# Patient Record
Sex: Female | Born: 1985 | Race: White | Hispanic: No | Marital: Single | Smoking: Former smoker
Health system: Southern US, Community
[De-identification: ages and names within clinical notes are randomized; demographics above are authoritative.]

## PROBLEM LIST (undated history)

## (undated) DIAGNOSIS — F419 Anxiety disorder, unspecified: Secondary | ICD-10-CM

## (undated) DIAGNOSIS — R519 Headache, unspecified: Secondary | ICD-10-CM

## (undated) DIAGNOSIS — G40909 Epilepsy, unspecified, not intractable, without status epilepticus: Secondary | ICD-10-CM

## (undated) DIAGNOSIS — N809 Endometriosis, unspecified: Secondary | ICD-10-CM

## (undated) DIAGNOSIS — E669 Obesity, unspecified: Secondary | ICD-10-CM

## (undated) DIAGNOSIS — R569 Unspecified convulsions: Secondary | ICD-10-CM

## (undated) DIAGNOSIS — R51 Headache: Secondary | ICD-10-CM

## (undated) HISTORY — PX: COLON SURGERY: SHX602

## (undated) HISTORY — DX: Epilepsy, unspecified, not intractable, without status epilepticus: G40.909

## (undated) HISTORY — DX: Endometriosis, unspecified: N80.9

## (undated) HISTORY — DX: Obesity, unspecified: E66.9

## (undated) HISTORY — PX: TUBAL LIGATION: SHX77

---
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
## 2004-03-05 ENCOUNTER — Inpatient Hospital Stay (HOSPITAL_COMMUNITY): Admission: EM | Admit: 2004-03-05 | Discharge: 2004-03-06 | Payer: Self-pay | Admitting: Emergency Medicine

## 2004-03-16 ENCOUNTER — Ambulatory Visit (HOSPITAL_COMMUNITY): Admission: RE | Admit: 2004-03-16 | Discharge: 2004-03-16 | Payer: Self-pay | Admitting: *Deleted

## 2004-04-02 ENCOUNTER — Observation Stay (HOSPITAL_COMMUNITY): Admission: EM | Admit: 2004-04-02 | Discharge: 2004-04-03 | Payer: Self-pay

## 2004-07-21 ENCOUNTER — Ambulatory Visit (HOSPITAL_COMMUNITY): Admission: AD | Admit: 2004-07-21 | Discharge: 2004-07-21 | Payer: Self-pay | Admitting: *Deleted

## 2004-07-30 ENCOUNTER — Ambulatory Visit (HOSPITAL_COMMUNITY): Admission: RE | Admit: 2004-07-30 | Discharge: 2004-07-30 | Payer: Self-pay | Admitting: *Deleted

## 2004-08-11 ENCOUNTER — Inpatient Hospital Stay (HOSPITAL_COMMUNITY): Admission: RE | Admit: 2004-08-11 | Discharge: 2004-08-13 | Payer: Self-pay | Admitting: *Deleted

## 2005-12-29 ENCOUNTER — Emergency Department (HOSPITAL_COMMUNITY): Admission: EM | Admit: 2005-12-29 | Discharge: 2005-12-29 | Payer: Self-pay | Admitting: Emergency Medicine

## 2006-04-09 ENCOUNTER — Emergency Department (HOSPITAL_COMMUNITY): Admission: EM | Admit: 2006-04-09 | Discharge: 2006-04-09 | Payer: Self-pay | Admitting: Emergency Medicine

## 2007-02-14 ENCOUNTER — Emergency Department (HOSPITAL_COMMUNITY): Admission: EM | Admit: 2007-02-14 | Discharge: 2007-02-14 | Payer: Self-pay | Admitting: Emergency Medicine

## 2007-04-10 ENCOUNTER — Emergency Department (HOSPITAL_COMMUNITY): Admission: EM | Admit: 2007-04-10 | Discharge: 2007-04-10 | Payer: Self-pay | Admitting: Emergency Medicine

## 2008-03-09 ENCOUNTER — Emergency Department (HOSPITAL_COMMUNITY): Admission: EM | Admit: 2008-03-09 | Discharge: 2008-03-09 | Payer: Self-pay | Admitting: Emergency Medicine

## 2008-09-28 ENCOUNTER — Emergency Department (HOSPITAL_COMMUNITY): Admission: EM | Admit: 2008-09-28 | Discharge: 2008-09-28 | Payer: Self-pay | Admitting: Emergency Medicine

## 2008-12-10 ENCOUNTER — Emergency Department (HOSPITAL_COMMUNITY): Admission: EM | Admit: 2008-12-10 | Discharge: 2008-12-10 | Payer: Self-pay | Admitting: Emergency Medicine

## 2009-04-18 ENCOUNTER — Emergency Department (HOSPITAL_COMMUNITY): Admission: EM | Admit: 2009-04-18 | Discharge: 2009-04-18 | Payer: Self-pay | Admitting: Emergency Medicine

## 2009-06-14 ENCOUNTER — Emergency Department (HOSPITAL_COMMUNITY): Admission: EM | Admit: 2009-06-14 | Discharge: 2009-06-14 | Payer: Self-pay | Admitting: Emergency Medicine

## 2009-06-30 ENCOUNTER — Ambulatory Visit (HOSPITAL_COMMUNITY): Admission: RE | Admit: 2009-06-30 | Discharge: 2009-06-30 | Payer: Self-pay | Admitting: Obstetrics and Gynecology

## 2009-07-20 ENCOUNTER — Ambulatory Visit (HOSPITAL_COMMUNITY): Admission: RE | Admit: 2009-07-20 | Discharge: 2009-07-20 | Payer: Self-pay | Admitting: Obstetrics and Gynecology

## 2009-08-10 ENCOUNTER — Ambulatory Visit (HOSPITAL_COMMUNITY): Admission: RE | Admit: 2009-08-10 | Discharge: 2009-08-10 | Payer: Self-pay | Admitting: Obstetrics and Gynecology

## 2009-08-31 ENCOUNTER — Ambulatory Visit (HOSPITAL_COMMUNITY): Admission: RE | Admit: 2009-08-31 | Discharge: 2009-08-31 | Payer: Self-pay | Admitting: Obstetrics and Gynecology

## 2009-09-21 ENCOUNTER — Inpatient Hospital Stay (HOSPITAL_COMMUNITY): Admission: RE | Admit: 2009-09-21 | Discharge: 2009-09-24 | Payer: Self-pay | Admitting: Obstetrics and Gynecology

## 2009-09-21 ENCOUNTER — Encounter (INDEPENDENT_AMBULATORY_CARE_PROVIDER_SITE_OTHER): Payer: Self-pay | Admitting: Obstetrics and Gynecology

## 2009-11-02 ENCOUNTER — Emergency Department (HOSPITAL_COMMUNITY): Admission: EM | Admit: 2009-11-02 | Discharge: 2009-11-02 | Payer: Self-pay | Admitting: Emergency Medicine

## 2009-11-03 ENCOUNTER — Ambulatory Visit (HOSPITAL_COMMUNITY): Admission: RE | Admit: 2009-11-03 | Discharge: 2009-11-03 | Payer: Self-pay | Admitting: Emergency Medicine

## 2010-01-28 ENCOUNTER — Emergency Department (HOSPITAL_COMMUNITY): Admission: EM | Admit: 2010-01-28 | Discharge: 2010-01-28 | Payer: Self-pay | Admitting: Emergency Medicine

## 2010-04-28 IMAGING — CR DG ABDOMEN ACUTE W/ 1V CHEST
3 series · 3 of 3 positions shown · non-contrast
Comparison: None.

CLINICAL DATA: Abdominal pain.  6 weeks postpartum by cesarean
section.  Asthma.

ACUTE ABDOMEN SERIES (ABDOMEN 2 VIEW & CHEST 1 VIEW)

[view not recorded (1 of 3)]
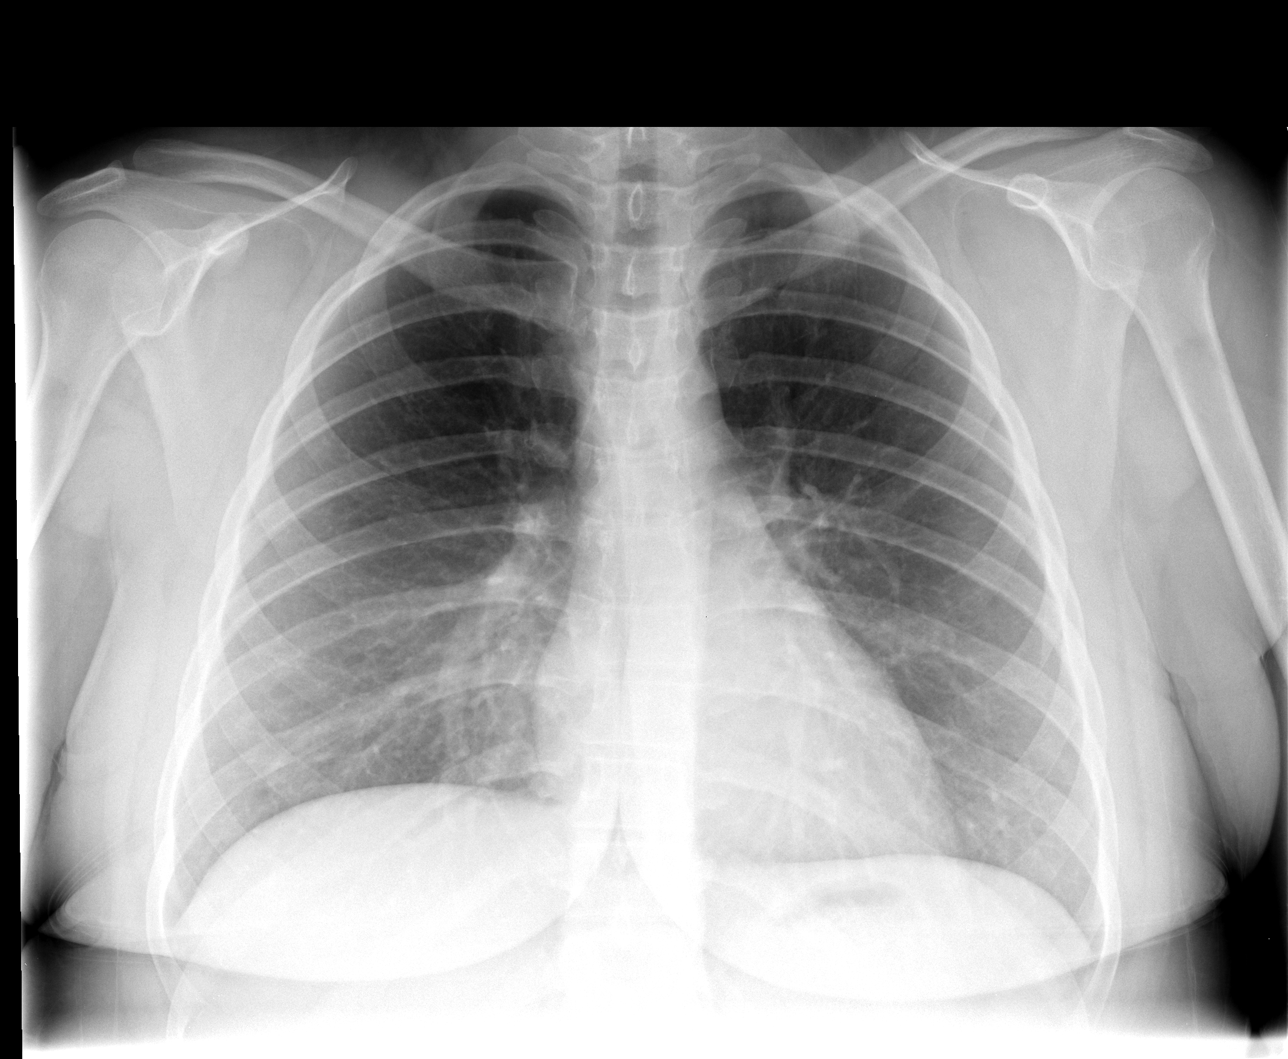

[view not recorded (2 of 3)]
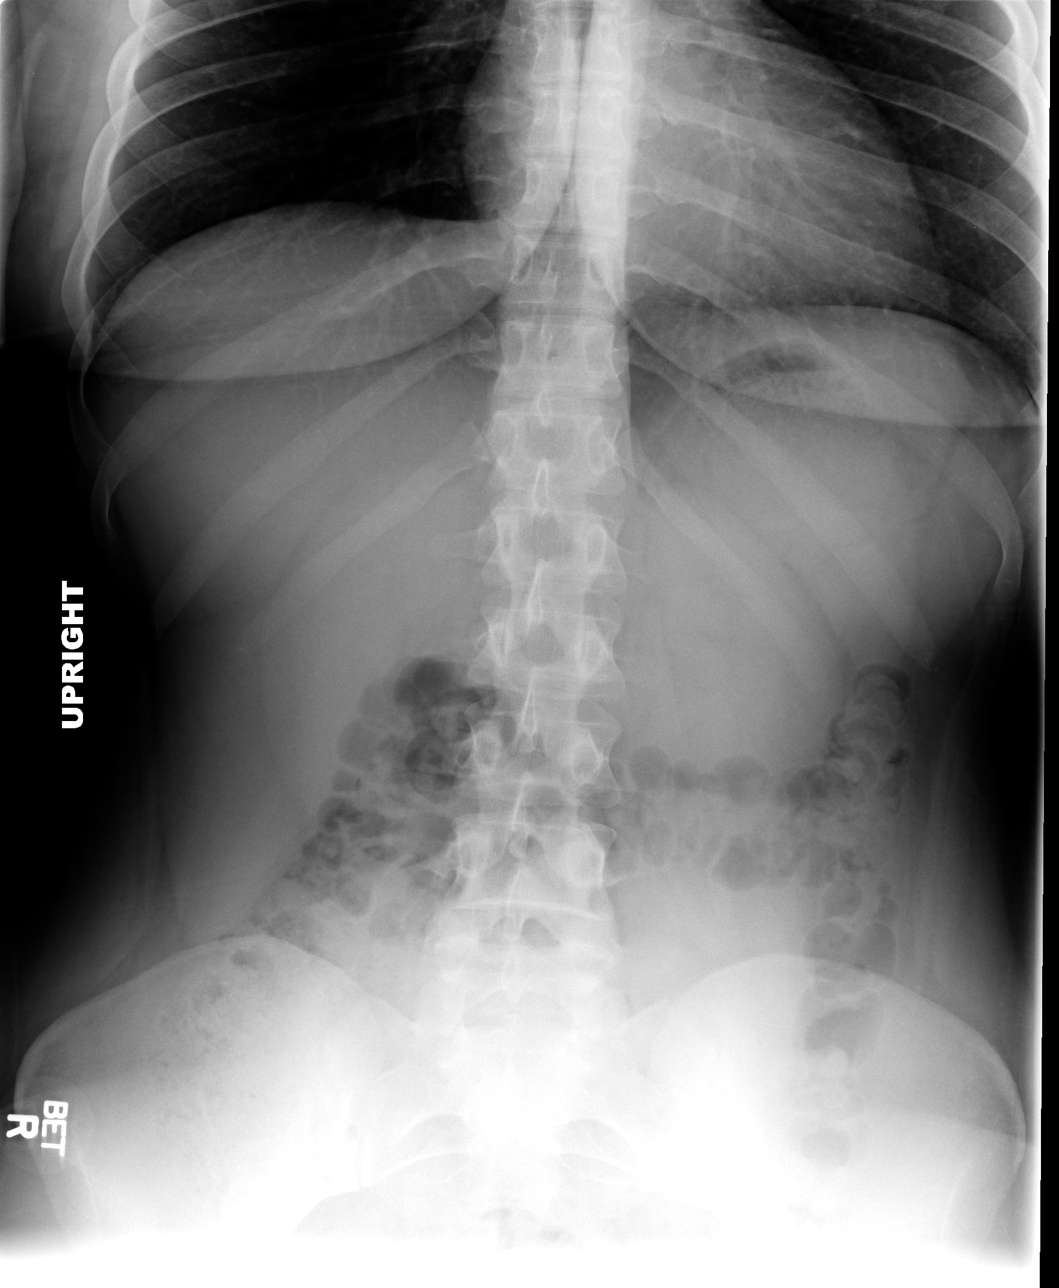

[view not recorded (3 of 3)]
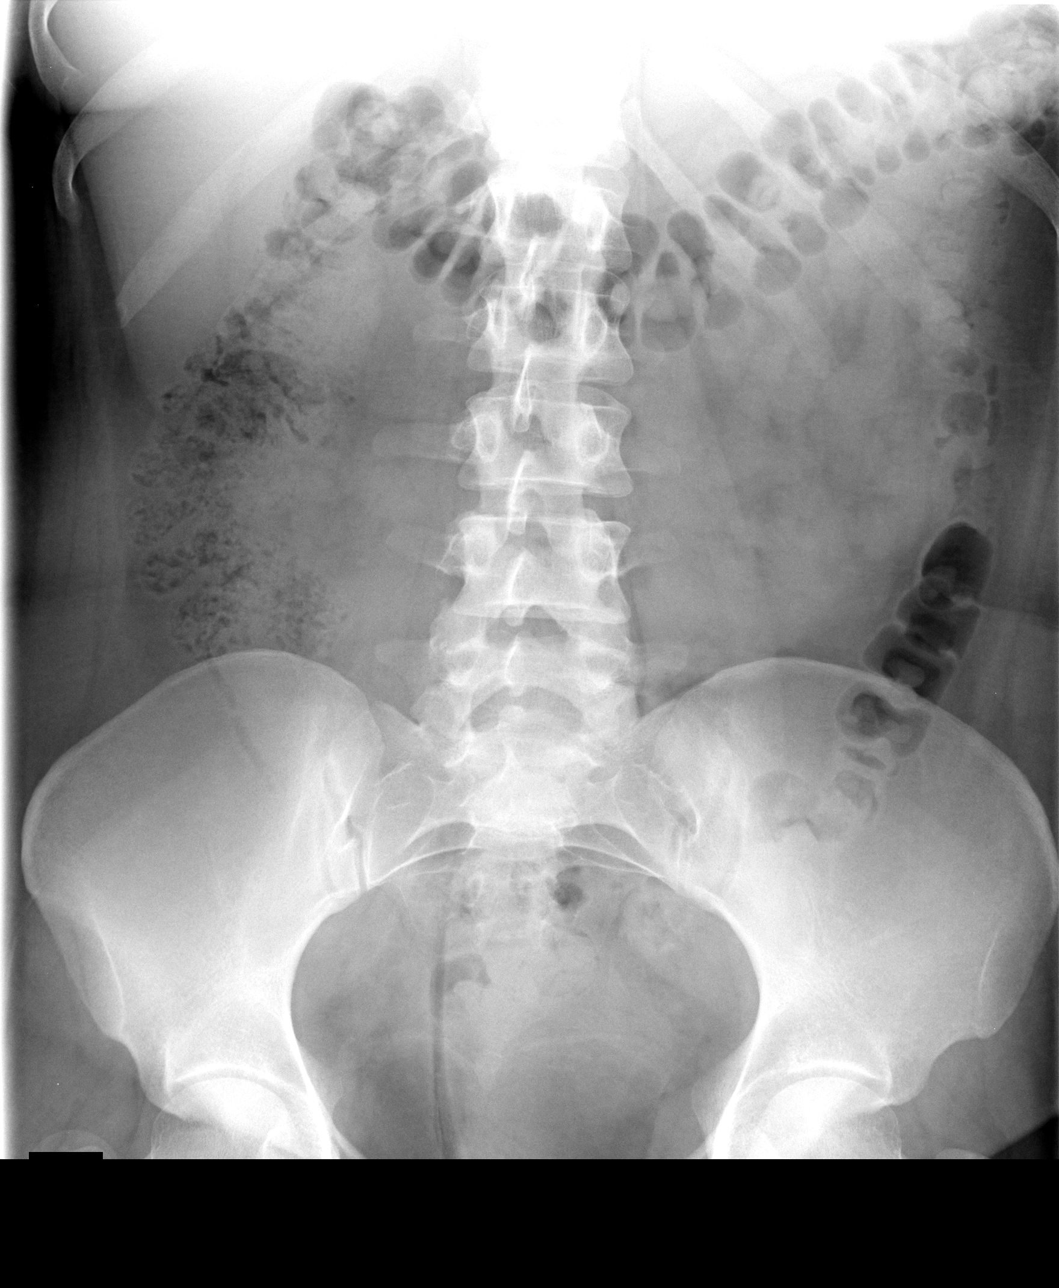

[3 of 3 positions shown; findings below may reference images not displayed]

FINDINGS: Cardiac and mediastinal contours appear normal.

The lungs appear clear.

No pleural effusion is identified.  No free intraperitoneal gas is
evident beneath the hemidiaphragms.  Gas and stool in the colon
appears normal.  No dilated bowel identified.  There appears to be
gas in the appendix, indicating a high likelihood of lack of
appendicitis.
IMPRESSION: 1.  Normal bowel gas pattern.  The lungs appear clear.

## 2010-05-07 ENCOUNTER — Emergency Department (HOSPITAL_COMMUNITY): Admission: EM | Admit: 2010-05-07 | Discharge: 2010-05-07 | Payer: Self-pay | Admitting: Emergency Medicine

## 2010-06-24 ENCOUNTER — Emergency Department (HOSPITAL_COMMUNITY)
Admission: EM | Admit: 2010-06-24 | Discharge: 2010-06-24 | Payer: Self-pay | Source: Home / Self Care | Admitting: Emergency Medicine

## 2010-09-26 LAB — CBC
HCT: 41.2 % (ref 36.0–46.0)
Hemoglobin: 14.6 g/dL (ref 12.0–15.0)
MCH: 30.7 pg (ref 26.0–34.0)
MCHC: 35.4 g/dL (ref 30.0–36.0)
MCV: 86.7 fL (ref 78.0–100.0)
RBC: 4.75 MIL/uL (ref 3.87–5.11)
RDW: 13 % (ref 11.5–15.5)
WBC: 7.3 10*3/uL (ref 4.0–10.5)

## 2010-09-26 LAB — BASIC METABOLIC PANEL
BUN: 8 mg/dL (ref 6–23)
Chloride: 108 mEq/L (ref 96–112)
Creatinine, Ser: 0.77 mg/dL (ref 0.4–1.2)

## 2010-09-26 LAB — DIFFERENTIAL
Eosinophils Absolute: 0.1 10*3/uL (ref 0.0–0.7)
Eosinophils Relative: 1 % (ref 0–5)
Lymphs Abs: 3.1 10*3/uL (ref 0.7–4.0)
Neutro Abs: 3.7 10*3/uL (ref 1.7–7.7)

## 2010-09-26 LAB — PHENYTOIN LEVEL, TOTAL: Phenytoin Lvl: 3.7 ug/mL — ABNORMAL LOW (ref 10.0–20.0)

## 2010-09-26 LAB — POCT CARDIAC MARKERS: Myoglobin, poc: 63.7 ng/mL (ref 12–200)

## 2010-09-28 LAB — CBC
HCT: 42.7 % (ref 36.0–46.0)
Hemoglobin: 14.7 g/dL (ref 12.0–15.0)
MCHC: 34.4 g/dL (ref 30.0–36.0)
Platelets: 352 10*3/uL (ref 150–400)
WBC: 7 10*3/uL (ref 4.0–10.5)

## 2010-09-28 LAB — DIFFERENTIAL
Basophils Absolute: 0 10*3/uL (ref 0.0–0.1)
Basophils Relative: 1 % (ref 0–1)
Eosinophils Absolute: 0.1 10*3/uL (ref 0.0–0.7)
Eosinophils Relative: 1 % (ref 0–5)
Lymphocytes Relative: 38 % (ref 12–46)
Lymphs Abs: 2.7 10*3/uL (ref 0.7–4.0)
Monocytes Absolute: 0.4 10*3/uL (ref 0.1–1.0)
Monocytes Relative: 6 % (ref 3–12)

## 2010-09-28 LAB — PREGNANCY, URINE: Preg Test, Ur: NEGATIVE

## 2010-09-28 LAB — COMPREHENSIVE METABOLIC PANEL
AST: 38 U/L — ABNORMAL HIGH (ref 0–37)
BUN: 6 mg/dL (ref 6–23)
Chloride: 107 mEq/L (ref 96–112)
Creatinine, Ser: 0.7 mg/dL (ref 0.4–1.2)
GFR calc Af Amer: >60 mL/min (ref >60)
GFR calc non Af Amer: >60 mL/min (ref >60)
Potassium: 4.3 mEq/L (ref 3.5–5.1)
Sodium: 137 mEq/L (ref 135–145)
Total Bilirubin: 0.2 mg/dL — ABNORMAL LOW (ref 0.3–1.2)
Total Protein: 7.2 g/dL (ref 6.0–8.3)

## 2010-09-28 LAB — URINALYSIS, ROUTINE W REFLEX MICROSCOPIC
Bilirubin Urine: NEGATIVE
Glucose, UA: NEGATIVE mg/dL
Ketones, ur: NEGATIVE mg/dL
Nitrite: NEGATIVE
pH: 7 (ref 5.0–8.0)

## 2010-09-28 LAB — POCT PREGNANCY, URINE: Preg Test, Ur: NEGATIVE

## 2010-10-01 LAB — DIFFERENTIAL
Eosinophils Absolute: 0 10*3/uL (ref 0.0–0.7)
Eosinophils Relative: 0 % (ref 0–5)
Lymphs Abs: 1.8 10*3/uL (ref 0.7–4.0)
Monocytes Relative: 5 % (ref 3–12)

## 2010-10-01 LAB — CBC
Hemoglobin: 12.8 g/dL (ref 12.0–15.0)
MCH: 31.1 pg (ref 26.0–34.0)
MCV: 89.5 fL (ref 78.0–100.0)
RBC: 4.1 MIL/uL (ref 3.87–5.11)
RDW: 12.5 % (ref 11.5–15.5)

## 2010-10-01 LAB — URINALYSIS, ROUTINE W REFLEX MICROSCOPIC
Bilirubin Urine: NEGATIVE
Hgb urine dipstick: NEGATIVE
Specific Gravity, Urine: >1.03 — ABNORMAL HIGH (ref 1.005–1.030)
pH: 5.5 (ref 5.0–8.0)

## 2010-10-01 LAB — BASIC METABOLIC PANEL
CO2: 22 mEq/L (ref 19–32)
Chloride: 110 mEq/L (ref 96–112)
GFR calc Af Amer: >60 mL/min (ref >60)
Sodium: 138 mEq/L (ref 135–145)

## 2010-10-04 LAB — COMPREHENSIVE METABOLIC PANEL
Albumin: 4.1 g/dL (ref 3.5–5.2)
BUN: 14 mg/dL (ref 6–23)
Chloride: 106 mEq/L (ref 96–112)
Creatinine, Ser: 1.07 mg/dL (ref 0.4–1.2)
GFR calc non Af Amer: >60 mL/min (ref >60)
Glucose, Bld: 84 mg/dL (ref 70–99)
Total Bilirubin: 0.3 mg/dL (ref 0.3–1.2)

## 2010-10-04 LAB — DIFFERENTIAL
Basophils Absolute: 0 10*3/uL (ref 0.0–0.1)
Lymphocytes Relative: 35 % (ref 12–46)
Monocytes Absolute: 0.6 10*3/uL (ref 0.1–1.0)
Neutro Abs: 5.1 10*3/uL (ref 1.7–7.7)
Neutrophils Relative %: 57 % (ref 43–77)

## 2010-10-04 LAB — CBC
HCT: 39.2 % (ref 36.0–46.0)
MCV: 88.6 fL (ref 78.0–100.0)
Platelets: 347 10*3/uL (ref 150–400)
RDW: 12.6 % (ref 11.5–15.5)
WBC: 9 10*3/uL (ref 4.0–10.5)

## 2010-10-04 LAB — URINALYSIS, ROUTINE W REFLEX MICROSCOPIC
Nitrite: NEGATIVE
Protein, ur: NEGATIVE mg/dL
Specific Gravity, Urine: >1.03 — ABNORMAL HIGH (ref 1.005–1.030)
Urobilinogen, UA: 0.2 mg/dL (ref 0.0–1.0)

## 2010-10-04 LAB — LIPASE, BLOOD: Lipase: 48 U/L (ref 11–59)

## 2010-10-04 LAB — PREGNANCY, URINE: Preg Test, Ur: NEGATIVE

## 2010-10-10 LAB — CBC
HCT: 33 % — ABNORMAL LOW (ref 36.0–46.0)
Hemoglobin: 11.4 g/dL — ABNORMAL LOW (ref 12.0–15.0)
Platelets: 192 10*3/uL (ref 150–400)
Platelets: 228 10*3/uL (ref 150–400)
RDW: 12.9 % (ref 11.5–15.5)
RDW: 13 % (ref 11.5–15.5)
WBC: 11.2 10*3/uL — ABNORMAL HIGH (ref 4.0–10.5)
WBC: 9.4 10*3/uL (ref 4.0–10.5)

## 2010-10-10 LAB — DIFFERENTIAL
Basophils Absolute: 0 10*3/uL (ref 0.0–0.1)
Lymphocytes Relative: 23 % (ref 12–46)
Lymphs Abs: 2.2 10*3/uL (ref 0.7–4.0)
Neutrophils Relative %: 71 % (ref 43–77)

## 2010-10-10 LAB — RPR: RPR Ser Ql: NONREACTIVE

## 2010-10-19 LAB — DIFFERENTIAL
Lymphocytes Relative: 15 % (ref 12–46)
Lymphs Abs: 2.3 10*3/uL (ref 0.7–4.0)
Monocytes Relative: 1 % — ABNORMAL LOW (ref 3–12)
Neutro Abs: 12.4 10*3/uL — ABNORMAL HIGH (ref 1.7–7.7)
Neutrophils Relative %: 83 % — ABNORMAL HIGH (ref 43–77)

## 2010-10-19 LAB — URINALYSIS, ROUTINE W REFLEX MICROSCOPIC
Bilirubin Urine: NEGATIVE
Ketones, ur: NEGATIVE mg/dL
Leukocytes, UA: NEGATIVE
Nitrite: NEGATIVE
Specific Gravity, Urine: 1.02 (ref 1.005–1.030)
Urobilinogen, UA: 0.2 mg/dL (ref 0.0–1.0)
pH: 6 (ref 5.0–8.0)

## 2010-10-19 LAB — PREGNANCY, URINE: Preg Test, Ur: POSITIVE

## 2010-10-19 LAB — CBC
HCT: 32.5 % — ABNORMAL LOW (ref 36.0–46.0)
Hemoglobin: 11.6 g/dL — ABNORMAL LOW (ref 12.0–15.0)
MCHC: 35.7 g/dL (ref 30.0–36.0)
MCV: 93.7 fL (ref 78.0–100.0)
Platelets: 283 10*3/uL (ref 150–400)
RDW: 13.2 % (ref 11.5–15.5)

## 2010-10-19 LAB — BASIC METABOLIC PANEL
BUN: 2 mg/dL — ABNORMAL LOW (ref 6–23)
CO2: 21 mEq/L (ref 19–32)
Chloride: 102 mEq/L (ref 96–112)
Glucose, Bld: 86 mg/dL (ref 70–99)
Potassium: 3.4 mEq/L — ABNORMAL LOW (ref 3.5–5.1)
Sodium: 131 mEq/L — ABNORMAL LOW (ref 135–145)

## 2010-10-19 LAB — GLUCOSE, CAPILLARY: Glucose-Capillary: 81 mg/dL (ref 70–99)

## 2010-10-19 LAB — URINE MICROSCOPIC-ADD ON

## 2010-10-19 LAB — HCG, QUANTITATIVE, PREGNANCY: hCG, Beta Chain, Quant, S: 3880 m[IU]/mL — ABNORMAL HIGH (ref <5)

## 2010-10-20 LAB — BASIC METABOLIC PANEL
Chloride: 104 mEq/L (ref 96–112)
GFR calc Af Amer: >60 mL/min (ref >60)
GFR calc non Af Amer: >60 mL/min (ref >60)
Potassium: 3.3 mEq/L — ABNORMAL LOW (ref 3.5–5.1)
Sodium: 132 mEq/L — ABNORMAL LOW (ref 135–145)

## 2010-10-20 LAB — URINE MICROSCOPIC-ADD ON

## 2010-10-20 LAB — URINALYSIS, ROUTINE W REFLEX MICROSCOPIC
Glucose, UA: 100 mg/dL — AB
Hgb urine dipstick: NEGATIVE
Ketones, ur: NEGATIVE mg/dL
pH: 6 (ref 5.0–8.0)

## 2010-10-20 LAB — PHENYTOIN LEVEL, TOTAL: Phenytoin Lvl: 3.4 ug/mL — ABNORMAL LOW (ref 10.0–20.0)

## 2010-10-20 LAB — RAPID URINE DRUG SCREEN, HOSP PERFORMED
Barbiturates: NOT DETECTED
Benzodiazepines: NOT DETECTED
Cocaine: NOT DETECTED

## 2010-10-27 LAB — COMPREHENSIVE METABOLIC PANEL
ALT: 20 U/L (ref 0–35)
AST: 22 U/L (ref 0–37)
Albumin: 3.6 g/dL (ref 3.5–5.2)
Alkaline Phosphatase: 78 U/L (ref 39–117)
BUN: 9 mg/dL (ref 6–23)
Chloride: 106 mEq/L (ref 96–112)
Potassium: 3.9 mEq/L (ref 3.5–5.1)
Sodium: 135 mEq/L (ref 135–145)
Total Bilirubin: 0.4 mg/dL (ref 0.3–1.2)
Total Protein: 5.9 g/dL — ABNORMAL LOW (ref 6.0–8.3)

## 2010-10-27 LAB — DIFFERENTIAL
Basophils Absolute: 0 10*3/uL (ref 0.0–0.1)
Basophils Relative: 1 % (ref 0–1)
Eosinophils Absolute: 0.3 10*3/uL (ref 0.0–0.7)
Eosinophils Relative: 3 % (ref 0–5)
Monocytes Absolute: 0.5 10*3/uL (ref 0.1–1.0)
Monocytes Relative: 6 % (ref 3–12)
Neutro Abs: 4.2 10*3/uL (ref 1.7–7.7)

## 2010-10-27 LAB — CBC
HCT: 40.2 % (ref 36.0–46.0)
Platelets: 344 10*3/uL (ref 150–400)
RDW: 12.9 % (ref 11.5–15.5)
WBC: 8.8 10*3/uL (ref 4.0–10.5)

## 2010-11-17 ENCOUNTER — Emergency Department (HOSPITAL_COMMUNITY): Payer: Medicaid Other

## 2010-11-17 ENCOUNTER — Emergency Department (HOSPITAL_COMMUNITY)
Admission: EM | Admit: 2010-11-17 | Discharge: 2010-11-17 | Disposition: A | Discharge status: Home / Self Care | Payer: Medicaid Other | Attending: Emergency Medicine | Admitting: Emergency Medicine

## 2010-11-17 DIAGNOSIS — H53149 Visual discomfort, unspecified: Secondary | ICD-10-CM | POA: Insufficient documentation

## 2010-11-17 DIAGNOSIS — L259 Unspecified contact dermatitis, unspecified cause: Secondary | ICD-10-CM | POA: Insufficient documentation

## 2010-11-17 DIAGNOSIS — G40802 Other epilepsy, not intractable, without status epilepticus: Secondary | ICD-10-CM | POA: Insufficient documentation

## 2010-11-17 DIAGNOSIS — R51 Headache: Secondary | ICD-10-CM | POA: Insufficient documentation

## 2010-11-17 DIAGNOSIS — R110 Nausea: Secondary | ICD-10-CM | POA: Insufficient documentation

## 2010-11-17 LAB — CBC
Hemoglobin: 14.1 g/dL (ref 12.0–15.0)
MCH: 31.2 pg (ref 26.0–34.0)
MCHC: 34.5 g/dL (ref 30.0–36.0)
RDW: 12.9 % (ref 11.5–15.5)

## 2010-11-17 LAB — DIFFERENTIAL
Basophils Absolute: 0 10*3/uL (ref 0.0–0.1)
Basophils Relative: 0 % (ref 0–1)
Eosinophils Absolute: 0.1 10*3/uL (ref 0.0–0.7)
Eosinophils Relative: 1 % (ref 0–5)
Monocytes Absolute: 0.5 10*3/uL (ref 0.1–1.0)
Monocytes Relative: 6 % (ref 3–12)
Neutro Abs: 4.3 10*3/uL (ref 1.7–7.7)

## 2010-11-17 LAB — COMPREHENSIVE METABOLIC PANEL
ALT: 19 U/L (ref 0–35)
AST: 17 U/L (ref 0–37)
CO2: 25 mEq/L (ref 19–32)
Calcium: 9 mg/dL (ref 8.4–10.5)
Creatinine, Ser: 0.84 mg/dL (ref 0.4–1.2)
GFR calc Af Amer: >60 mL/min (ref >60)
GFR calc non Af Amer: >60 mL/min (ref >60)
Sodium: 138 mEq/L (ref 135–145)
Total Protein: 6.7 g/dL (ref 6.0–8.3)

## 2010-11-17 LAB — PHENYTOIN LEVEL, TOTAL: Phenytoin Lvl: 2.7 ug/mL — ABNORMAL LOW (ref 10.0–20.0)

## 2010-12-01 ENCOUNTER — Emergency Department (HOSPITAL_COMMUNITY)
Admission: EM | Admit: 2010-12-01 | Discharge: 2010-12-01 | Disposition: A | Discharge status: Home / Self Care | Payer: Medicaid Other | Attending: Emergency Medicine | Admitting: Emergency Medicine

## 2010-12-01 ENCOUNTER — Emergency Department (HOSPITAL_COMMUNITY): Payer: Medicaid Other

## 2010-12-01 DIAGNOSIS — G40909 Epilepsy, unspecified, not intractable, without status epilepticus: Secondary | ICD-10-CM | POA: Insufficient documentation

## 2010-12-01 DIAGNOSIS — R51 Headache: Secondary | ICD-10-CM | POA: Insufficient documentation

## 2010-12-01 LAB — URINALYSIS, ROUTINE W REFLEX MICROSCOPIC
Glucose, UA: NEGATIVE mg/dL
Hgb urine dipstick: NEGATIVE
Specific Gravity, Urine: 1.025 (ref 1.005–1.030)
Urobilinogen, UA: 0.2 mg/dL (ref 0.0–1.0)
pH: 6 (ref 5.0–8.0)

## 2010-12-01 LAB — CBC
HCT: 41.8 % (ref 36.0–46.0)
Hemoglobin: 14.4 g/dL (ref 12.0–15.0)
MCV: 91.1 fL (ref 78.0–100.0)
WBC: 8.6 10*3/uL (ref 4.0–10.5)

## 2010-12-01 LAB — DIFFERENTIAL
Basophils Absolute: 0 10*3/uL (ref 0.0–0.1)
Lymphocytes Relative: 35 % (ref 12–46)
Lymphs Abs: 3 10*3/uL (ref 0.7–4.0)
Monocytes Absolute: 0.5 10*3/uL (ref 0.1–1.0)
Neutro Abs: 5 10*3/uL (ref 1.7–7.7)

## 2010-12-01 LAB — BASIC METABOLIC PANEL
BUN: 12 mg/dL (ref 6–23)
CO2: 23 mEq/L (ref 19–32)
Chloride: 106 mEq/L (ref 96–112)
Glucose, Bld: 90 mg/dL (ref 70–99)
Potassium: 3.8 mEq/L (ref 3.5–5.1)
Sodium: 138 mEq/L (ref 135–145)

## 2010-12-02 NOTE — H&P (Signed)
NAME:  Whitney Scott, Whitney Scott               ACCOUNT NO.:  [id number]   MEDICAL RECORD NO.:  [id number]          PATIENT TYPE:  AMB   LOCATION:  DAY                           FACILITY:  [facility]   PHYSICIAN:  Langley Gauss, MD     DATE OF BIRTH:  05-Sep-1985   DATE OF ADMISSION:  08/11/2004  DATE OF DISCHARGE:  LH                                HISTORY & PHYSICAL   REASON FOR ADMISSION:  The patient is admitted for primary low transverse  cesarean section.   HISTORY OF PRESENT ILLNESS:  The patient is a 25 year old gravida 1, para 0  at 38+ weeks' gestation who was noted to have findings of relative CPD on  clinical pelvimetry; in addition, despite increased frequency of uterine  contractions, the patient has failed to have engagement of the fetal vertex.  She is noted to have a narrow mid-pelvis as well as very narrow pubic rami.  Pertinently, the patient and her mother state that they are built alike.  The patient's mother states that she herself experienced a very long labor.  Preparations were actually made to proceed with C-section, at which time  forceps and vacuum-assisted delivery were performed.  The patient's mother  states that the delivery itself was very difficult and she took an extended  period of time for recovery.  The patient herself is now very apprehensive  regarding a similar scenario during the course of labor, thus the patient  elects to proceed with primary low transverse cesarean section.  The patient  understands that there are increased risks associated with C-section  including increased blood loss, increased risk of infection, necessity of  proceeding with repeat C-section with subsequent pregnancies and possible  future problems with implantation of the placenta at the uterine incision  site.  The patient is also agreeable to proceed with spinal analgesic.  The  patient's prenatal course was complicated by new diagnosis of seizure  disorder; she was noted to have a  generalized seizure which was witnessed on  labor and delivery.  She was seen and evaluated by Dr. Darleen Crocker A. [place].  EEG was normal.  The patient subsequently also had sleep studies done which  were normal.  The patient is currently taking Keppra 500 mg p.o. q.a.m. and  500 mg p.o. q.p.m.  She has had no additional seizure activity now times  several months' duration, especially after achieving therapeutic dosage  range.  Pertinently, there is no other family history of seizure disorder,  nor does the patient experience or describe anything which would be  considered to be seizure activity prior to the pregnancy.   PAST MEDICAL HISTORY:  She had some type of bowel surgery as a child.   ALLERGIES:  She does describe multiple allergies including PENICILLIN,  BACTRIM, KEFLEX and also hallucinates by taking P.O. AMBIEN.   SOCIAL HISTORY:  The patient is a nonsmoker.  Father of the baby is named  Joseph Art, Montez Hageman.  The patient herself is not employed.   LABORATORY STUDIES OF PREGNANCY:  GBS carrier status is noted to be  negative.   PHYSICAL  EXAMINATION:  GENERAL:  Adolescent white female, 5 foot 3 inches,  prepregnancy 160s, most recent weight 192.  VITAL SIGNS:  Blood pressure is 111/73, pulse of 80, respiratory rate is 20.  HEENT:  Negative.  NECK:  No adenopathy.  Neck is supple.  Thyroid is nonpalpable.  LUNGS:  Lungs are clear.  CARDIOVASCULAR:  Regular rate and rhythm.  ABDOMEN:  Abdomen is soft and nontender.  No surgical scars are identified.  Fundal height is 38 cm.  She is vertex presentation by [employer].  Fetal heart tones auscultated in the 150s.  Good fetal movement is  described.   ACCESSORY CLINICAL DATA:  During most recent office visit dated August 03, 2004, the patient had complained of occasional episodes of trickling of  fluid.  At that time, ultrasonographic parameters revealed adequate fetal  growth and normal amniotic fluid volume; cervix, however,  remained closed  with the vertex non-engaged.   ASSESSMENT AND PLAN:  Following the findings on physical examination, the  patient is very adamant regarding her desire to proceed with primary low  transverse cesarean section; her mother likewise is in agreement with this  to avoid a difficult delivery such as she herself experienced.  The  patient's mother states that in retrospect, she feels she would have been  far better off had she delivered by cesarean section rather than a difficult  vacuum and forceps delivery.  Risks and benefits of the procedure have been  discussed with the patient, consents are signed and we will be proceeding  a.m. of August 11, 2004.  Dr. Francoise Schaumann Halm's office has previously been  notified.      DC/MEDQ  D:  08/11/2004  T:  08/11/2004  Job:  [id number]

## 2010-12-02 NOTE — Group Therapy Note (Signed)
NAME:  Whitney Scott, KNISLEY                         ACCOUNT NO.:  [id number]   MEDICAL RECORD NO.:  [id number]                   PATIENT TYPE:  INP   LOCATION:  A319                                 FACILITY:  [facility]   PHYSICIAN:  Scott A. Gerda Diss, M.D.               DATE OF BIRTH:  November 10, 1985   DATE OF PROCEDURE:  07/13/2003  DATE OF DISCHARGE:                                   PROGRESS NOTE   SUMMARY:  The patient overall is feeling slightly improved this morning.  No  vomiting.  Tolerating liquids.  Sodium has come up, white count looks good.  Still has chest wall tenderness.  Will add Solu-Medrol to help with the  costochondritis and switch from Cipro to Levaquin.  Probably in the hospital  for 48 hours.  Young patient also under a significant amount of  psychological stressors with her dad having terminal cancer.  This may be  playing some role into the severity of her symptoms but I truly feel that  she needs to be admitted in and treated because of failed outpatient  management, multiple ER visits and office visits for similar problem, the  hyponatremia, the severe costochondritis and the inability to keep any  medications down.      ___________________________________________                                            [name]. Gerda Diss, M.D.   SAL/MEDQ  D:  07/13/2003  T:  07/13/2003  Job:  [id number]
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
## 2010-12-02 NOTE — H&P (Signed)
NAME:  Whitney Scott, Whitney Scott                         ACCOUNT NO.:  [id number]   MEDICAL RECORD NO.:  [id number]                   PATIENT TYPE:  INP   LOCATION:  A319                                 FACILITY:  [facility]   PHYSICIAN:  Scott A. Gerda Diss, M.D.               DATE OF BIRTH:  08-Sep-1985   DATE OF ADMISSION:  07/12/2003  DATE OF DISCHARGE:                                HISTORY & PHYSICAL   CHIEF COMPLAINT:  Vomiting, chest pain.   HISTORY OF PRESENT ILLNESS:  An 25 year old white female who three weeks ago  had pneumonia and went to [facility].  Had an x-ray done  and was told she had pneumonia and placed on antibiotics.  Had a lot of  coughing, congestion.  Coughing and congestion continued and had left chest  wall pain, discomfort.  Proceeded on to have increased pain and discomfort  in the chest wall every time she took a deep breath or coughed, more in the  anterior chest.  From this, she was seen by Dr. Nobie Putnam, had another x-ray  done and was told she had walking pneumonia and placed on antibiotics and  then she followed up at [facility] approximately a week ago  and then was seen in our office by Baruch Merl, FMP, diagnosed with  costochondritis.  It was felt that infection had gone.  She was placed on  Phenergan, Vicodin and an anti-inflammatory, probably Prednisone.  She  continued to have chest wall pain, was unable to keep any of the medicine  down and threw up multiple times over the weekend.  Came to the emergency  department because of this.   PAST MEDICAL HISTORY:  Normal past medical history.   SOCIAL HISTORY:  Lives with parents.  Under significant distress because her  dad has terminal colon cancer.   FAMILY HISTORY:  Noncontributory.   REVIEW OF SYSTEMS:  Negative for headaches, abdominal pain, diarrhea,  dysuria, urinary frequency.   PHYSICAL EXAMINATION:  HEENT:  Benign.  NECK:  Supple.  CHEST:  Clear to auscultation,  coarse cough is noted.  Chest wall extremely  tender, especially in the anterior aspect.  HEART:  Regular.  ABDOMEN:  Soft.  EXTREMITIES:  No edema.   LABORATORY DATA:  CBC:  11.1 white count with no specific shift.  Sodium  127, BUN 9, creatinine 0.6, specific gravity greater than 1.030.  Ketones  are negative.  Blood is moderate.  Has a lot of epithelial cells along with  WBC's and bacteria.   ASSESSMENT/PLAN:  1. Recent walking pneumonia-still coughing, so we will go ahead and cover     for atypicals, place on Levaquin.  2. Costochondritis-I feel this is the patient's main problem, but she was     unable to tolerate any of the oral medicines, therefore, treat with IV     Solu-Medrol for a couple of days and then  hopefully, be able to set forth     a     oral regimen that we will work for.  3. Probable urinary tract infection-Levaquin should cover this.  4. Hyponatremia-IV normal saline should help with this as well.     ___________________________________________                                         [name]. Gerda Diss, M.D.   SAL/MEDQ  D:  07/13/2003  T:  07/13/2003  Job:  [id number]

## 2010-12-02 NOTE — Op Note (Signed)
[name], Scott               ACCOUNT NO.:  [id number]   MEDICAL RECORD NO.:  [id number]          PATIENT TYPE:  INP   LOCATION:  A410                          FACILITY:  [facility]   PHYSICIAN:  Langley Gauss, MD     DATE OF BIRTH:  July 22, 1985   DATE OF PROCEDURE:  08/11/2004  DATE OF DISCHARGE:                                 OPERATIVE REPORT   PREOPERATIVE DIAGNOSES:  1.  A 38-1/2-week intrauterine pregnancy.  2.  New onset seizure disorder, diagnosed during this pregnancy.  3.  Relative cephalopelvic disproportion with borderline pelvis.  4.  Patient desired to proceed with primary cesarean section.   POSTOPERATIVE DIAGNOSES:   OPERATION PERFORMED:  Primary low transverse cesarean section.   SURGEON:  Langley Gauss, MD   ESTIMATED BLOOD LOSS:  800 mL.   ANALGESIA:  Spinal.   COMPLICATIONS:  None.   SPECIMENS:  Arterial cord gas and cord blood were obtained from the  umbilical cord.  The placenta was examined and noted to be apparently intact  with a three-vessel umbilical cord.  The placenta was sent to the laboratory  due to maternal history of seizure disorder.   PEDIATRICIAN:  __________   DRAINS:  Jackson-Pratt drain placed in the subcutaneous space. A Foley  catheter was sterilely draining clear yellow urine.   DESCRIPTION OF PROCEDURE:  The patient was taken to the operating room.  Vital signs are stable.  Fetal heart tone was obtained.  In a seated  position, spinal analgesic was administered without complications.  The  patient was placed on the operating room table with a slight left lateral  tilt, prepped and draped in the usual sterile manner after assurance of  adequate surgical analgesia.  Pfannenstiel incision was utilized and  dissected down to the fascial plane utilizing the knife cauterizing bleeders  along the way.  The fascia was then incised in a transverse curvilinear  manner while sharply dissecting off the underlying rectus muscle.  This  was  done atraumatically.  The fascial edges were grasped using straight Kocher  clamps, the fascia was dissected off the underlying rectus muscle in the  midline, both superiorly and inferiorly utilizing the Mayo scissors to  improve the operative space.  The rectus muscles then bluntly separated.  The peritoneal cavity was atraumatically bluntly entered at the superior-  most portion of the incision.  The peritoneal incision extended superiorly  and inferiorly.  Inferiorly we directly visualized the bladder to avoid its  accidental injury.  Bladder blade was then placed.  The lower uterine  segment was identified.  A bladder flap was then created from the  vesicouterine fold in the avascular plane.  A knife was used to score a low  transverse incision.  The lower uterine segment was noted to be markedly  thin.  Intact amniotic sac was encountered.  My index finger was used to  bluntly extend the low transverse uterine incision.  Allis clamp was used to  artificially rupture membranes with findings of clear amniotic fluid.  The  right hand then reached into the uterine cavity.  The  head of the infant was  then flexed and elevated to the level of the uterine incision.  The  disposable Silastic suction connected to wall suction was then applied to  the infant's vertex.  Gentle traction combined with fundal pressure results  in very easy atraumatic delivery of the infant's vertex.  Mouth and nares  were bulb suctioned, no clear amniotic fluid.  The remainder of the infant  likewise delivered atraumatically.  Umbilical cord was milked toward the  infant.  The cord was doubly clamped and cut.  Mouth was again bulb  suctioned of clear amniotic fluid.  __________  resulted in separation which  upon examination appears to be intact placenta and associated 3-vessel  umbilical cord.  The uterus was exteriorized.  Intrauterine exploration  revealed no retained placenta fragments.  Uterine incision  was not extended.  The uterine incision was closed in two layers with 0 chromic in a running  locked fashion, second layer being an imbricating layer.  This resulted in  excellent hemostasis.  Cul-de-sac was irrigated free of all clots.  Tubes  and ovaries noted to be normal in appearance.  Several __________  cysts  were identified in the left adnexal region.  The uterus was returned to the  pelvic cavity.  Sponge, needle and instrument counts were correct times two  at this point.  Peritoneal edges were grasped using Kelly clamps.  The  peritoneum and overlying rectus muscle were closed with a continuous running  0 Vicryl suture.  The fascia was closed with a continuous running #1 PDS  suture.  Subcutaneous fat was examined for bleeders.  Bleeders were  cauterized.  Jackson-Pratt drain was placed in the subcu space through a  separate exit wound through the left apex of the incision.  This was sutured  in place.  Three horizontal mattress sutures of #1 PDS suture were placed as  retention type sutures.  The skin was then completely closed utilizing skin  staples.  Blood loss 30 mL.  0.5% bupivacaine injected along the entirety of  the incision to facilitate postoperative analgesia.  The patient was then  transferred to the recovery room in stable condition.  She continues to  drain clear yellow urine.  Operative findings discussed with the patient's  waiting family.  The patient does plan on bottle feeding.      DC/MEDQ  D:  08/11/2004  T:  08/11/2004  Job:  [id number]

## 2010-12-02 NOTE — Discharge Summary (Signed)
[name], Whitney Scott               ACCOUNT NO.:  [id number]   MEDICAL RECORD NO.:  [id number]          PATIENT TYPE:  INP   LOCATION:  A428                          FACILITY:  [facility]   PHYSICIAN:  Langley Gauss, MD     DATE OF BIRTH:  05/16/1986   DATE OF ADMISSION:  08/11/2004  DATE OF DISCHARGE:  01/28/2006LH                                 DISCHARGE SUMMARY   DIAGNOSES:  1.  A 38-plus-week intrauterine pregnancy.  2.  New-onset seizure disorder during this pregnancy.  3.  Relative cephalopelvic disproportion.   PROCEDURE PERFORMED:  Primary low transverse cesarean section with delivery  of viable, vigorous female infant.   DISPOSITION:  The patient is to follow up in the office in 4 days' time for  staple removal from Pfannenstiel incision.  JP drain is removed at the time  of discharge.   DISCHARGE MEDICATIONS:  1.  Tylox number 30.  2.  Phenergan number 30.  3.  In addition, the patient is to continue with the Keppra 1000 mg p.o.      b.i.d.   She is advised to follow up with Dr. Gerilyn Pilgrim for continued management.  She  was given copies of standardized discharge instructions.  Infant  circumcision is performed at the time of discharge, and the family is  prepared to meet their financial obligations.   CURRENT LABORATORY STUDIES:  Hemoglobin 12.8, hematocrit 36.2 with a white  count of 11.2.  Postoperative day number one 10.6/29.8 with a white count of  10.1.  GBS status is negative.   HOSPITAL COURSE:  Admitted August 11, 2004.  Primary low transverse  cesarean section was performed without complications.  She __________ spinal  analgesic.  Postoperatively  the patient did very well.  She had excellent  urine output and was able to ambulate and void on postoperative day number  one after removal of the Foley catheter.  She did well with the transition  to p.o. Tylox.  She had normal resumption of bowel function and  some minimal nausea which responded well to p.o.  Phenergan on the day of  discharge.  On postoperative day number two, the patient is now fully  ambulatory.  The vital signs have remained stable.  She is afebrile.  She  has been seizure free.  Thus, she was discharged to home on August 13, 2004.      DC/MEDQ  D:  08/13/2004  T:  08/13/2004  Job:  [id number]   cc:   Kofi A. Gerilyn Pilgrim, M.D.  [address], [place]  [place]  [place] [address]  Fax: [phone]

## 2010-12-02 NOTE — Discharge Summary (Signed)
NAME:  Whitney Scott, Whitney Scott                         ACCOUNT NO.:  [id number]   MEDICAL RECORD NO.:  [id number]                   PATIENT TYPE:  INP   LOCATION:  A416                                 FACILITY:   PHYSICIAN:  Langley Gauss, M.D.                DATE OF BIRTH:  January 09, 1986   DATE OF ADMISSION:  03/05/2004  DATE OF DISCHARGE:  03/07/2004                                 DISCHARGE SUMMARY   Admitted on March 05, 2004.  Hospital care subsequently provided by Dr.  Duane Lope, 03/06/2004, with discharge performed by Dr. Duane Lope in my  absence on March 07, 2004.   DISCHARGE DIAGNOSES:  1.  A 15-week intrauterine pregnancy.  2.  Seizure disorder.   CONSULTATIONS:  The patient was seen on March 05, 2004, by Dr. Darleen Crocker A.  [place], who recommended testing which was subsequently performed and also  made recommendations for medical therapy.   PERTINENT LABORATORY STUDIES:  MRI of the brain without contrast was  performed as requested which revealed no acute infarct, no evidence of  hypertensive encephalopathy.   EKG was performed which was noted to be within normal limits.   Urine drug screen was completely negative.  Hemoglobin 11.9, hematocrit  32.5, white count 11.5.  Electrolytes within normal limits with the  exception of mild hypokalemia at 3.3.  CK requested by Dr. Darleen Crocker A. [place]  is normal at 76.  Rubella immune.  Urinalysis completely unremarkable.  A  positive blood type.  Negative antibody screen.  RPR is nonreactive.   DISCHARGE MEDICATIONS:  The patient is to continue with prenatal vitamins.   The patient is started on Keppra category C drug.  She will be taking a dose  which seems to be 1 gram q.h.s. 500 mg q.a.m.   HOSPITAL COURSE:  Examination per Dr. Gerilyn Pilgrim revealed no neurological  changes.   I initially saw and I admitted the patient on March 05, 2004, with new  onset of seizure disorder.  The patient previously had what was felt to be  febrile  seizures as a child only.  She was admitted on March 05, 2004 for  continued evaluation.  The patient did well throughout the day.  She  tolerated a regular general diet.  However, there was a history that this  seizure had been witnessed by the husband, and thus was well documented  generalized seizure activity.  In the p.m. on March 05, 2004, in my absence  after I had signed out the patient to Dr. Lazaro Arms, the patient was  noted to have a grand mal seizure while on labor and delivery which was of  short duration, and no patient injury resulted.  Shortly thereafter, the  patient was seen by Dr. Gerilyn Pilgrim who made the therapeutic recommendations.   The patient was started on the p.o. Keppra.  Additional testing was all  essentially negative.  The patient did well  during the remainder of the  hospitalization with no recurrent seizure activity noted.  Thus, with the  symptom-free interval and no additional seizure activity, the patient was  prepared for discharged on March 07, 2004.  She was discharged in my  absence by Dr. Lazaro Arms.   FOLLOWUP:  She was given instructions to follow up in the office in one  weeks's time, or with additional seizure activity.  The patient has also  been given instructions to follow up by Dr. Gerilyn Pilgrim in the next one to two  weeks time.     ___________________________________________                                         Langley Gauss, M.D.   DC/MEDQ  D:  03/21/2004  T:  03/21/2004  Job:  [id number]

## 2010-12-02 NOTE — Consult Note (Signed)
NAME:  Whitney Scott, Whitney Scott                         ACCOUNT NO.:  [id number]   MEDICAL RECORD NO.:  [id number]                   PATIENT TYPE:  OBV   LOCATION:  A416                                 FACILITY:  [facility]   PHYSICIAN:  Kofi A. Gerilyn Pilgrim, M.D.              DATE OF BIRTH:  1985-10-15   DATE OF CONSULTATION:  DATE OF DISCHARGE:                                   CONSULTATION   CONSULTING PHYSICIAN:  Kofi A. Gerilyn Pilgrim, M.D.   REASON FOR CONSULTATION:  Seizures.   IMPRESSION:  Recurrent, unprovoked, generalized, tonic/clonic seizures.  No  clear risk factors are identified.   RECOMMENDATIONS:  1. Given the recurrent seizure activity that is unprovoked, I believe we     should start the patient on antiepileptic medication.  I had a lengthy     discussion with the patient's mother, discussing the pros and cons.  I     believe in general starting the seizure medication outweighs the     detrimental risk that are associated with seizures to both the fetus and     the mother.  Lamictal is probably the safest drug but this medication     requires a very slow titration over several weeks and months.  I would     recommend Keppra which is a class C.  Other medications such as Depakote,     Dilantin, phenobarbital, etcetera are class D medications.  We will start     her on a higher dose than usual.  We have given her a gram p.o. and then     have her take 500 mg in the morning.  2. I am going to suggest that a brain MRI be done ASAP.  3. At some point in time, an EEG should also be done when available.   HISTORY:  This is a young female who is [redacted] weeks pregnant with really no  risk factors for seizures.  She apparently had what appeared to be a  generalized tonic/clonic seizure as witnessed by her boyfriend this morning.  She seemed to have stiffening, flexion of the right upper extremity,  followed by jerking of all the other extremities.  It lasted for about a  minute.  She did have oral  trauma with blood coming from her mouth.  Afterwards, she was postictal additionally.  No urinary incontinence was  reported however.  The patient was on the floor, and just before I examined  her she apparently had another event that was witnessed by her mother.  Again, she had flexion type stiffening of the right upper extremity and  jerking of the other extremities.  No oral trauma was reported during this  event.  She did roll her eyes back and had some frothing.  The event was  also witnessed by one of the nursing staff.  The event lasted for about a  minute or so with some postictal lethargy  and confusion.  She apparently was  started on a magnesium bolus and has had some nausea afterwards.  This  patient does complain of some headaches associated with this seizure and  also the initial seizure this morning.  There is no family history of  seizures.  The patient apparently was born at term and was not premature.  No apparent complications.  There is no history of developmental delay.  No  history of head trauma, encephalitis, meningitis, head injuries, or stroke.  There is no family history of seizures.   PAST MEDICAL HISTORY:  __________  otherwise unremarkable.   FAMILY HISTORY:  Negative.   REVIEW OF SYSTEMS:  As in the history of present illness.   PHYSICAL EXAMINATION:  GENERAL:  Shows a mildly overweight lady in bed.  She  is in some distress from recent seizures and recurrent emesis.  VITAL SIGNS:  The latest vital signs:  Temperature 98, blood pressure 87/30,  pulse 70, respirations 18.  HEENT:  Atraumatic, normocephalic.  NECK:  Supple.  Also facial, head, and neck regions do not reveal any  bruits.  LUNGS:  Clear to auscultation bilaterally.  CARDIOVASCULAR:  Revealed normal S1 S2.  EXTREMITIES:  Show no edema.  SKIN:  Somewhat pale-looking.  NEUROLOGIC:  She is somewhat confused and she is postictal, but she is  responsive.  Cranial nerve evaluation shows that  pupils are 4-mm and  reactive.  Visual fields are intact.  Extraocular movements are full.  Face  and muscle strength is normal.  Tongue is midline.  Motor:  Shows she moves  all four extremities normally, symmetrically, and equally.  The exact  strength could not be tested.  No pronator drift is noted.  Coordination is  grossly unremarkable.  The patient has an upgoing toe on the right and down  on the left.  She responds to sensation to painful sensation bilaterally and  equally.   IMPORTANT LABORATORY EVALUATION:  Sodium 134.  The CBC is unremarkable.  Other chemistries are essentially unremarkable.  Urinalysis is negative, no  protein noted.   Thanks for this consultation.      ___________________________________________                                            Perlie Gold Gerilyn Pilgrim, M.D.   KAD/MEDQ  D:  03/04/2004  T:  03/04/2004  Job:  [id number]

## 2010-12-02 NOTE — Discharge Summary (Signed)
NAME:  Whitney Scott, Whitney Scott               ACCOUNT NO.:  [id number]   MEDICAL RECORD NO.:  [id number]           PATIENT TYPE:  OIB   LOCATION:                                FACILITY:  [facility]   PHYSICIAN:  Langley Gauss, MD     DATE OF BIRTH:  04-19-1986   DATE OF ADMISSION:  DATE OF DISCHARGE:  01/05/2006LH                                 DISCHARGE SUMMARY   A 25 year old gravida 1, para 0 at [redacted] weeks gestation who presents with the  chief complaint of one week ago and again this p.m. a trickle of clear fluid  with no odor but with the consistency of urine which ran down her leg. She  did not notify anyone regarding this occurrence one week previously.  She  also complains of some bloody show with wiping only and some pelvic pressure  and pelvic pain.  The patient's prenatal course has been complicated by  diagnosis and new finding of generalized seizure disorder. She had been  hospitalized during the early second trimester with a questionable history  of a seizure occurring at home; however, when she was observed during  hospitalization to have evidence of a grand mal seizure occurring here.  Dr.  Gerilyn Pilgrim evaluated the patient, agreed to the diagnosis of new onset seizure  disorder, and the patient was started on Keppra which she continues to take  throughout the pregnancy at 500 q.a.m. and 500 q.h.s.  She has been seizure  free since that time.   The patient is compliant with medications.  Her mother stayed with her most  of her pregnancy, and her mother is very certain that the seizures occur  only when she is under times of great stress due to presence of her  boyfriend.  She otherwise has had good fetal growth and no side effects from  the Keppra.  No history of head trauma and, as stated previously, no prior  history of any type of seizure disorder.   PAST MEDICAL HISTORY:  Otherwise negative.   PAST SURGICAL HISTORY:  Otherwise negative.   ALLERGIES:  No known drug allergies.   PHYSICAL EXAMINATION:  GENERAL: Very immature white female.  Boyfriend  present.  VITAL SIGNS: Temperature 97.8, pulse 95, respiratory rate 16, blood pressure  112/68.  HEENT:  Negative.  NECK:  No adenopathy.  Neck is supple.  Thyroid is not palpable.  LUNGS:  Clear.  CARDIOVASCULAR:  Regular rate and rhythm.  ABDOMEN:  Soft and nontender.  No surgical scars are identified. She is  vertex presentation by Leopold's maneuvers with a fundal height of 36 cm.  EXTREMITIES:  Normal.  PELVIC:  Normal external genitalia.  No lesions or ulcerations identified.  Sterile speculum examination is performed.  There is no pooling of any  amniotic fluid noted, rather normal, mucoid-appearing discharge.  Wet prep  is performed. Nitrazine test is negative.  Cervix appears closed.  Digital  examination reveals cervix to be closed.  External fetal monitor reveals  reassuring fetal heart rate with normal long-term variability.  No  significant uterine activity is identified.  IMPRESSION AND PLAN:  A 35-week intrauterine pregnancy with vague abdominal  pain but no evidence of any single event uterine activity.  The vaginal  discharge is most consistent with normal-appearing leukorrhea and is not  rupture of membranes.  Signs and symptoms of labor as well as spontaneous  rupture of membranes reviewed with the patient who is discharged to home on  same date of service to continue with Keppra and to continue with prenatal  visits according to previously scheduled appointments.     Vira Blanco   DC/MEDQ  D:  07/24/2004  T:  07/24/2004  Job:  [id number]

## 2010-12-02 NOTE — Op Note (Signed)
NAME:  Whitney Scott, Whitney Scott                         ACCOUNT NO.:  [id number]   MEDICAL RECORD NO.:  [id number]                   PATIENT TYPE:  OBV   LOCATION:  A412                                 FACILITY:  [facility]   PHYSICIAN:  Langley Gauss, M.D.                DATE OF BIRTH:  08-29-1985   DATE OF PROCEDURE:  04/03/2004  DATE OF DISCHARGE:  04/03/2004                                 OPERATIVE REPORT   PROCEDURE:  Pammy Vesey is currently hospitalized for renewed seizure  activity.  On the day previously, fetal heart tones had been obtained with  some difficulty by the nursing staff.  On April 03, 2004, the patient  states that she is unable to detect any fetal movement where previously she  had noted fairly good fetal movement.  A limited transabdominal ultrasound  greater than 14 weeks was performed by Dr. Langley Gauss on April 03, 2004.  This revealed a single intrauterine pregnancy, currently vertex  presentation, normal amniotic fluid volume.  Excellent fetal movement was  noted, particularly the legs.  Fetal cardiac activity was also identified.  There was noted to be a fundal placenta.  Growth parameters were all  consistent with [redacted] weeks gestation at this time.  Anatomic survey was not  performed.  Of note, during the sonogram, the patient stated that most of  the fetal movements which were clearly seen on the ultrasound were not  perceivable by the patient.  She was advised that fetal activity should  increase and become more noticeable with advancing gestational age.      DC/MEDQ  D:  04/03/2004  T:  04/04/2004  Job:  [id number]

## 2010-12-02 NOTE — H&P (Signed)
NAME:  Whitney Scott, Whitney Scott                         ACCOUNT NO.:  [id number]   MEDICAL RECORD NO.:  [id number]                   PATIENT TYPE:  OBV   LOCATION:  A416                                 FACILITY:  [facility]   PHYSICIAN:  Langley Gauss, M.D.                DATE OF BIRTH:  Dec 21, 1985   DATE OF ADMISSION:  03/04/2004  DATE OF DISCHARGE:                                HISTORY & PHYSICAL   HISTORY OF PRESENT ILLNESS:  The patient was initially seen and evaluated in  the emergency room by Dr. Nicoletta Dress. [place], after which time she was referred  to the fourth floor on an observation status.   The patient is a 25 year old gravida 1, para 0, with an EDC of August 26, 2004, at 15-16 weeks' gestation, who was brought in today by EMS.  Report is  that she was sleeping with her boyfriend.  He was awakened by her activity,  at which time, by his report, he witnessed her convulsing, lasting a total  of about 5 minutes' duration, after which time she became very lethargic and  he was unable to arouse her.  At that point in time, after witnessing this,  he contacted 911 and the patient was transported by EMS to [facility].  Pertinently, by the report of the mother, at age 78, while having  chickenpox and a fever of 103.4, the child experienced a single episode of a  seizure.  At that time, mom said she was diagnosed only a febrile seizure.  No workup, evaluation or treatment were rendered.  The patient has had no  problems up to this point in time.  She has been seen prenatally, at which  time she has had normal 8-week ultrasound with fetal cardiac activity  identified.   PAST MEDICAL HISTORY:  Does have a history of:  1. Asthma.  2. Bronchitis.  3. Pneumonia.   CURRENT MEDICATIONS:  Current medications include prenatal vitamins.  She  does have albuterol inhaler to use on a p.r.n. basis for respiratory  complaints.   ALLERGIES:  She has allergy to PENICILLIN, KEFLEX and  BACTRIM.   SOCIAL HISTORY:  Social history is pertinent for the patient does live with  a smoker; she is a nonsmoker.   PHYSICAL EXAMINATION:  GENERAL:  On physical examination, she is noted to be  in no acute distress, eating breakfast at this time.  VITAL SIGNS:  Weight is 154 pounds.  Pulse of 94, respiratory rate is 18,  temperature 98.9, 109/75.  HEENT:  HEENT reveals a tongue piercing; patient reportedly had bit her  tongue with some bleeding with the seizure activity, supposedly choking on  the blood.  LUNGS:  The lungs are clear.  CARDIOVASCULAR:  Exam notes regular rate and rhythm.  ABDOMEN:  Abdomen is soft and nontender.  Gravid uterus identified.  Fetal  heart tones are auscultated in the  emergency room.  EXTREMITIES:  Extremities are noted to be normal.  PELVIC:  Examination is not performed.   LABORATORY STUDIES:  In the emergency room, 12-lead EKG within normal  limits.   Hemoglobin 11.9, hematocrit 32.5.  Urinalysis pertinent for 0-2 red cells,  rare epithelial cells, remainder of urinalysis negative and within normal  limits.   IMPRESSION:  There is a strong family history in that mother currently has a  seizure disorder.  The mother is convinced that the patient's significant  stress level at present has resulted in the recurrence of this seizure  history.  Most pertinently, the patient's father died 10-06-03 and  currently there are legal matters ongoing regarding management of the will.  The patient is currently on observation status.  She is tolerating a regular  general diet very well, is fully alert and oriented.  I have contacted Dr.  Darleen Crocker A. [place]'s office to have her seen in consultation regarding further  evaluation and necessity of medical therapy.     ___________________________________________                                         Langley Gauss, M.D.   DC/MEDQ  D:  03/04/2004  T:  03/04/2004  Job:  [id number]   cc:   Darleen Crocker A. Gerilyn Pilgrim,  M.D.  [address] [place]  [place]  [place] [address]  Fax: [phone]

## 2010-12-02 NOTE — Discharge Summary (Signed)
NAME:  Whitney Scott, Whitney Scott                         ACCOUNT NO.:  [id number]   MEDICAL RECORD NO.:  [id number]                   PATIENT TYPE:  INP   LOCATION:  A319                                 FACILITY:  [facility]   PHYSICIAN:  Scott A. Gerda Diss, M.D.               DATE OF BIRTH:  31-May-1986   DATE OF ADMISSION:  07/12/2003  DATE OF DISCHARGE:  07/15/2003                                 DISCHARGE SUMMARY   DISCHARGE DIAGNOSES:  1. Gastroenteritis -- possibly medication intolerance.  2. Costochondritis.  3. Recent pneumonia.  4. Hyponatremia.  5. Failed outpatient management.   HOSPITAL COURSE:  [age]-year-old white female was admitted in after  having three separate ER visits plus an office visit over the past three  weeks.  Approximately three weeks ago, she was diagnosed with pneumonia and  was treated, had a lot of pleuritic chest pain, followed up at another ER,  was treated for the pleuritic chest pain, continued to have pleuritic chest  pain, was seen in the office, placed on prednisone, anti-inflammatory and  Vicodin, proceeded to have several days of vomiting, inability to keep  anything down and proceeded to feel very weak, came to the emergency  department, had a sodium of 127, along with severe costochondritis and the  inability to keep any medications down, therefore, was admitted in with the  above diagnoses.  The patient was treated and over the course of the next  several days, improved gradually.  The patient was stable for discharge on  the 29th.   DISCHARGE MEDICATIONS:  Discharged home on:  1. Daypro 600 mg two daily for costochondritis for next 14 to 21 days.  2. Percocet 5/325 mg, #24, one q.4 h. p.r.n. pain, caution -- drowsiness.  3. Phenergan 25 mg one q.8 h. p.r.n. nausea.   ACTIVITY:  No work the rest of this week and return to work next Monday.   FOLLOWUP:  To follow up in our office in approximately one week and to  report to [place] sooner should problems  ensue.     ___________________________________________                                         [name]. Gerda Diss, M.D.   SAL/MEDQ  D:  07/15/2003  T:  07/15/2003  Job:  [id number]

## 2011-01-01 ENCOUNTER — Emergency Department (HOSPITAL_COMMUNITY)
Admission: EM | Admit: 2011-01-01 | Discharge: 2011-01-01 | Disposition: A | Discharge status: Home / Self Care | Payer: Medicaid Other | Attending: Emergency Medicine | Admitting: Emergency Medicine

## 2011-01-01 ENCOUNTER — Emergency Department (HOSPITAL_COMMUNITY): Payer: Medicaid Other

## 2011-01-01 DIAGNOSIS — Z880 Allergy status to penicillin: Secondary | ICD-10-CM | POA: Insufficient documentation

## 2011-01-01 DIAGNOSIS — S93409A Sprain of unspecified ligament of unspecified ankle, initial encounter: Secondary | ICD-10-CM | POA: Insufficient documentation

## 2011-01-01 DIAGNOSIS — Y92009 Unspecified place in unspecified non-institutional (private) residence as the place of occurrence of the external cause: Secondary | ICD-10-CM | POA: Insufficient documentation

## 2011-01-01 DIAGNOSIS — X500XXA Overexertion from strenuous movement or load, initial encounter: Secondary | ICD-10-CM | POA: Insufficient documentation

## 2011-03-12 ENCOUNTER — Emergency Department (HOSPITAL_COMMUNITY)
Admission: EM | Admit: 2011-03-12 | Discharge: 2011-03-12 | Disposition: A | Discharge status: Home / Self Care | Payer: Medicaid Other | Attending: Emergency Medicine | Admitting: Emergency Medicine

## 2011-03-12 ENCOUNTER — Encounter: Payer: Self-pay | Admitting: Emergency Medicine

## 2011-03-12 DIAGNOSIS — S0990XA Unspecified injury of head, initial encounter: Secondary | ICD-10-CM

## 2011-03-12 DIAGNOSIS — F17200 Nicotine dependence, unspecified, uncomplicated: Secondary | ICD-10-CM | POA: Insufficient documentation

## 2011-03-12 DIAGNOSIS — Y92009 Unspecified place in unspecified non-institutional (private) residence as the place of occurrence of the external cause: Secondary | ICD-10-CM | POA: Insufficient documentation

## 2011-03-12 DIAGNOSIS — W1809XA Striking against other object with subsequent fall, initial encounter: Secondary | ICD-10-CM | POA: Insufficient documentation

## 2011-03-12 DIAGNOSIS — Z881 Allergy status to other antibiotic agents status: Secondary | ICD-10-CM | POA: Insufficient documentation

## 2011-03-12 DIAGNOSIS — R569 Unspecified convulsions: Secondary | ICD-10-CM | POA: Insufficient documentation

## 2011-03-12 DIAGNOSIS — Z880 Allergy status to penicillin: Secondary | ICD-10-CM | POA: Insufficient documentation

## 2011-03-12 DIAGNOSIS — S1093XA Contusion of unspecified part of neck, initial encounter: Secondary | ICD-10-CM | POA: Insufficient documentation

## 2011-03-12 DIAGNOSIS — T148XXA Other injury of unspecified body region, initial encounter: Secondary | ICD-10-CM

## 2011-03-12 DIAGNOSIS — S0003XA Contusion of scalp, initial encounter: Secondary | ICD-10-CM | POA: Insufficient documentation

## 2011-03-12 DIAGNOSIS — Z888 Allergy status to other drugs, medicaments and biological substances status: Secondary | ICD-10-CM | POA: Insufficient documentation

## 2011-03-12 HISTORY — DX: Unspecified convulsions: R56.9

## 2011-03-12 LAB — BASIC METABOLIC PANEL
BUN: 6 mg/dL (ref 6–23)
Calcium: 8.7 mg/dL (ref 8.4–10.5)
Creatinine, Ser: 0.53 mg/dL (ref 0.50–1.10)
GFR calc non Af Amer: >60 mL/min (ref >60)
Glucose, Bld: 102 mg/dL — ABNORMAL HIGH (ref 70–99)

## 2011-03-12 LAB — RAPID URINE DRUG SCREEN, HOSP PERFORMED
Amphetamines: NOT DETECTED
Opiates: POSITIVE — AB
Tetrahydrocannabinol: POSITIVE — AB

## 2011-03-12 LAB — CBC
Hemoglobin: 13.6 g/dL (ref 12.0–15.0)
MCH: 31.9 pg (ref 26.0–34.0)
MCV: 89.9 fL (ref 78.0–100.0)
Platelets: 320 10*3/uL (ref 150–400)
RBC: 4.27 MIL/uL (ref 3.87–5.11)

## 2011-03-12 LAB — URINALYSIS, ROUTINE W REFLEX MICROSCOPIC
Bilirubin Urine: NEGATIVE
Hgb urine dipstick: NEGATIVE
Protein, ur: NEGATIVE mg/dL
Urobilinogen, UA: 0.2 mg/dL (ref 0.0–1.0)

## 2011-03-12 LAB — DIFFERENTIAL
Eosinophils Absolute: 0 10*3/uL (ref 0.0–0.7)
Eosinophils Relative: 0 % (ref 0–5)
Lymphs Abs: 2.6 10*3/uL (ref 0.7–4.0)
Monocytes Relative: 6 % (ref 3–12)

## 2011-03-12 LAB — ETHANOL: Alcohol, Ethyl (B): <11 mg/dL (ref 0–11)

## 2011-03-12 MED ORDER — OXYCODONE-ACETAMINOPHEN 5-325 MG PO TABS: 2.0000 | ORAL_TABLET | ORAL | Status: AC | PRN

## 2011-03-12 MED ORDER — HYDROMORPHONE HCL 1 MG/ML IJ SOLN
1.0000 mg | Freq: Once | INTRAMUSCULAR | Status: AC
  Administered 2011-03-12: 1 mg via INTRAVENOUS
  Filled 2011-03-12: qty 1

## 2011-03-12 MED ORDER — SODIUM CHLORIDE 0.9 % IV SOLN
1000.0000 mg | Freq: Once | INTRAVENOUS | Status: DC
  Filled 2011-03-12: qty 20

## 2011-03-12 MED ORDER — PHENYTOIN SODIUM EXTENDED 100 MG PO CAPS: 200.0000 mg | ORAL_CAPSULE | Freq: Three times a day (TID) | ORAL | Status: DC

## 2011-03-12 MED ORDER — HYDROMORPHONE HCL 2 MG/ML IJ SOLN
INTRAMUSCULAR | Status: AC
  Administered 2011-03-12: 1 mg via INTRAVENOUS
  Filled 2011-03-12: qty 1

## 2011-03-12 MED ORDER — HYDROMORPHONE HCL 1 MG/ML IJ SOLN
INTRAMUSCULAR | Status: AC
  Filled 2011-03-12: qty 1

## 2011-03-12 MED ORDER — HYDROMORPHONE HCL 1 MG/ML IJ SOLN
0.5000 mg | Freq: Once | INTRAMUSCULAR | Status: AC
  Administered 2011-03-12: 0.5 mg via INTRAVENOUS

## 2011-03-12 MED ORDER — HYDROMORPHONE HCL 1 MG/ML IJ SOLN
1.0000 mg | Freq: Once | INTRAMUSCULAR | Status: AC
  Administered 2011-03-12: 1 mg via INTRAVENOUS

## 2011-03-12 MED ORDER — PHENYTOIN SODIUM 50 MG/ML IJ SOLN
INTRAMUSCULAR | Status: AC
  Filled 2011-03-12: qty 20

## 2011-03-12 MED ORDER — ONDANSETRON HCL 4 MG/2ML IJ SOLN
4.0000 mg | Freq: Once | INTRAMUSCULAR | Status: AC
  Administered 2011-03-12: 4 mg via INTRAVENOUS
  Filled 2011-03-12: qty 2

## 2011-03-12 MED ORDER — OXYCODONE-ACETAMINOPHEN 5-325 MG PO TABS
1.0000 | ORAL_TABLET | Freq: Once | ORAL | Status: AC
  Administered 2011-03-12: 1 via ORAL
  Filled 2011-03-12: qty 1

## 2011-03-12 NOTE — ED Notes (Signed)
Pt had seizure and fell at home hitting her forehead. Hx of seizures per mother. Pt has never had a seizure during wakeful state before per mother. Pt c/o headache, sore tongue. Dried blood noted in nares. Small abrasion with bruising to L forehead. Pt reports she started having seizures about 6 years ago during pregnancy. Usually has 2-3 per year, has had 2 today. Pt reports MD has had difficulty getting med levels high enough.

## 2011-03-12 NOTE — ED Notes (Signed)
Pt alert at this time, c/o headache,sensitive to light.   Pt has abrasion and some swelling noted to forehead.  Also some dried blood in nose and appears to have bit her tongue.  Pt doesn't remember falling.  Pt's grips strong and equal and leg strength appears equal.

## 2011-03-12 NOTE — ED Provider Notes (Signed)
History  Scribed for Dr. Effie Shy, the patient was seen in room 18. The chart was scribed by Gilman Schmidt. The patients care was started at 1647.  CSN: [id number] Arrival date & time: 03/12/2011  5:03 PM  Chief Complaint  Patient presents with  . Seizures   HPI  HPI  Whitney Scott is a 25 y.o. female who presents to the Emergency Department complaining of seizures. The patient reports that she had a seizure at home while sleeping and fell and hit her head. Patient does have a history of seizures. Patient states that her mother witnessed the seizure and patient doesn't not know what happened. Associated signs and symptoms include headache, sore tongue, blood in nares, small abrasion to the forehead.  PCP: [facility]  HPI ELEMENTS:   Timing: intermittent  Modifying factors: typically Dilantin 3x daily Context:  as above Associated symptoms:  Headache and sore tongue    PAST MEDICAL HISTORY:  Past Medical History  Diagnosis Date  . Seizures     PAST SURGICAL HISTORY:  Past Surgical History  Procedure Date  . Cesarean section     MEDICATIONS:  Previous Medications   No medications on file     ALLERGIES:  Allergies as of 03/12/2011 - Review Complete 03/12/2011  Allergen Reaction Noted  . Bactrim  12/01/2010  . Keflex  12/01/2010  . Penicillins  12/01/2010     FAMILY HISTORY:   Family History  Problem Relation Age of Onset  . Seizures Mother   . Diabetes Mother   . Heart failure Mother   . Cancer Father      SOCIAL HISTORY: History  Substance Use Topics  . Smoking status: Current Everyday Smoker -- 0.2 packs/day for 5 years    Types: Cigarettes  . Smokeless tobacco: Never Used  . Alcohol Use: Yes     couple of times a month    Review of Systems  HENT: Positive for nosebleeds (dried blood in nares).        Sore tongue  Abrasions to forehead  Eyes: Positive for photophobia.  Gastrointestinal: Negative for nausea and vomiting.    Genitourinary: Negative for difficulty urinating.  Neurological: Positive for headaches.    Physical Exam  BP 98/73  Pulse 97  Temp(Src) 98.3 F (36.8 C) (Oral)  Resp 22  SpO2 100%  LMP 03/08/2011  Physical Exam  Constitutional: She is oriented to person, place, and time. She appears well-developed and well-nourished.  HENT:  Head: Normocephalic.  Right Ear: External ear normal.  Left Ear: External ear normal.       Contusion of anterior tongue Blood in nose Nose intact and not disformed  Eyes: Conjunctivae and EOM are normal. Pupils are equal, round, and reactive to light.  Neck: Normal range of motion and phonation normal. Neck supple.  Cardiovascular: Normal rate, regular rhythm, normal heart sounds and intact distal pulses.   Pulmonary/Chest: Effort normal and breath sounds normal. She exhibits no bony tenderness.  Abdominal: Soft. Normal appearance. There is no tenderness.  Musculoskeletal: Normal range of motion.  Neurological: She is alert and oriented to person, place, and time. She has normal strength. No cranial nerve deficit or sensory deficit. She exhibits normal muscle tone. Coordination normal.  Skin: Skin is warm, dry and intact.  Psychiatric: She has a normal mood and affect. Her behavior is normal. Judgment and thought content normal.     OTHER DATA REVIEWED: Nursing notes, vital signs, and past medical records reviewed.  DIAGNOSTIC STUDIES: Oxygen Saturation is 100% on room air, normal by my interpretation.     LABS  Results for orders placed during the hospital encounter of 03/12/11  CBC      Component Value Range   WBC 10.1  4.0 - 10.5 (K/uL)   RBC 4.27  3.87 - 5.11 (MIL/uL)   Hemoglobin 13.6  12.0 - 15.0 (g/dL)   HCT 96.0  45.4 - 09.8 (%)   MCV 89.9  78.0 - 100.0 (fL)   MCH 31.9  26.0 - 34.0 (pg)   MCHC 35.4  30.0 - 36.0 (g/dL)   RDW 11.9  14.7 - 82.9 (%)   Platelets 320  150 - 400 (K/uL)  DIFFERENTIAL      Component Value Range    Neutrophils Relative 67  43 - 77 (%)   Neutro Abs 6.8  1.7 - 7.7 (K/uL)   Lymphocytes Relative 26  12 - 46 (%)   Lymphs Abs 2.6  0.7 - 4.0 (K/uL)   Monocytes Relative 6  3 - 12 (%)   Monocytes Absolute 0.6  0.1 - 1.0 (K/uL)   Eosinophils Relative 0  0 - 5 (%)   Eosinophils Absolute 0.0  0.0 - 0.7 (K/uL)   Basophils Relative 0  0 - 1 (%)   Basophils Absolute 0.0  0.0 - 0.1 (K/uL)  BASIC METABOLIC PANEL      Component Value Range   Sodium 135  135 - 145 (mEq/L)   Potassium 3.6  3.5 - 5.1 (mEq/L)   Chloride 103  96 - 112 (mEq/L)   CO2 23  19 - 32 (mEq/L)   Glucose, Bld 102 (*) 70 - 99 (mg/dL)   BUN 6  6 - 23 (mg/dL)   Creatinine, Ser 5.62  0.50 - 1.10 (mg/dL)   Calcium 8.7  8.4 - 13.0 (mg/dL)   GFR calc non Af Amer >60  >60 (mL/min)   GFR calc Af Amer >60  >60 (mL/min)  PHENYTOIN LEVEL, TOTAL      Component Value Range   Phenytoin Lvl <2.5 (*) 10.0 - 20.0 (ug/mL)  ETHANOL      Component Value Range   Alcohol, Ethyl (B) <11  0 - 11 (mg/dL)      ED COURSE / COORDINATION OF CARE: 1647  - Patient evaluated by ED physician, Dilaudid, Zofran, labs, and UA ordered 2001- Recheck by ED physician. Labs discussed and pt notified of low Phenytoin levels, IV ordered.  11:02 PM- No Sz. In ED. Pt c/o mild frontal HA and nose pain, no neck pain.   MDM: Seizure secondary to low Dilantin level. Contusion forehead and nose, doubt intracranial injury. No but no metabolic instability or apparent infection as etiology for seizure.   PLAN:  Discharge The patient is to return the emergency department if there is any worsening of symptoms. I have reviewed the discharge instructions with the patient  CONDITION ON DISCHARGE: Stable  MEDICATIONS GIVEN IN THE E.D.  Medications  HYDROmorphone (DILAUDID) injection 1 mg (not administered)  ondansetron (ZOFRAN) injection 4 mg (4 mg Intravenous Given 03/12/11 1714)    DISCHARGE MEDICATIONS: New Prescriptions   No medications on file    Procedures   I personally performed the services described in this documentation, which was scribed in my presence. The recorded information has been reviewed and considered. Flint Melter, MD      Flint Melter, MD 03/12/11 [phone]

## 2011-04-26 ENCOUNTER — Emergency Department (HOSPITAL_COMMUNITY)
Admission: EM | Admit: 2011-04-26 | Discharge: 2011-04-27 | Disposition: A | Discharge status: Home / Self Care | Payer: Medicaid Other | Attending: Emergency Medicine | Admitting: Emergency Medicine

## 2011-04-26 ENCOUNTER — Other Ambulatory Visit: Payer: Self-pay

## 2011-04-26 ENCOUNTER — Encounter (HOSPITAL_COMMUNITY): Payer: Self-pay | Admitting: *Deleted

## 2011-04-26 DIAGNOSIS — R5381 Other malaise: Secondary | ICD-10-CM | POA: Insufficient documentation

## 2011-04-26 DIAGNOSIS — R569 Unspecified convulsions: Secondary | ICD-10-CM | POA: Insufficient documentation

## 2011-04-26 DIAGNOSIS — R531 Weakness: Secondary | ICD-10-CM

## 2011-04-26 DIAGNOSIS — R42 Dizziness and giddiness: Secondary | ICD-10-CM | POA: Insufficient documentation

## 2011-04-26 DIAGNOSIS — F17200 Nicotine dependence, unspecified, uncomplicated: Secondary | ICD-10-CM | POA: Insufficient documentation

## 2011-04-26 DIAGNOSIS — R509 Fever, unspecified: Secondary | ICD-10-CM | POA: Insufficient documentation

## 2011-04-26 HISTORY — DX: Anxiety disorder, unspecified: F41.9

## 2011-04-26 LAB — COMPREHENSIVE METABOLIC PANEL
Albumin: 4 g/dL (ref 3.5–5.2)
Alkaline Phosphatase: 109 U/L (ref 39–117)
BUN: 12 mg/dL (ref 6–23)
Calcium: 9 mg/dL (ref 8.4–10.5)
Potassium: 3.5 mEq/L (ref 3.5–5.1)
Total Protein: 7 g/dL (ref 6.0–8.3)

## 2011-04-26 LAB — PHENYTOIN LEVEL, TOTAL: Phenytoin Lvl: 7.8 ug/mL — ABNORMAL LOW (ref 10.0–20.0)

## 2011-04-26 LAB — CBC
HCT: 42.5 % (ref 36.0–46.0)
MCH: 31.5 pg (ref 26.0–34.0)
MCHC: 34.8 g/dL (ref 30.0–36.0)
RDW: 12.2 % (ref 11.5–15.5)

## 2011-04-26 MED ORDER — ONDANSETRON HCL 4 MG/2ML IJ SOLN
4.0000 mg | Freq: Once | INTRAMUSCULAR | Status: AC
  Administered 2011-04-26: 4 mg via INTRAVENOUS
  Filled 2011-04-26: qty 2

## 2011-04-26 MED ORDER — SODIUM CHLORIDE 0.9 % IV SOLN: INTRAVENOUS | Status: DC

## 2011-04-26 MED ORDER — DIPHENHYDRAMINE HCL 50 MG/ML IJ SOLN
50.0000 mg | Freq: Once | INTRAMUSCULAR | Status: AC
  Administered 2011-04-26: 50 mg via INTRAVENOUS
  Filled 2011-04-26: qty 1

## 2011-04-26 MED ORDER — SODIUM CHLORIDE 0.9 % IV BOLUS (SEPSIS)
1000.0000 mL | Freq: Once | INTRAVENOUS | Status: AC
  Administered 2011-04-26: 1000 mL via INTRAVENOUS

## 2011-04-26 NOTE — ED Provider Notes (Signed)
Scribed for Ward Givens, MD, the patient was seen in room APA09/APA09. This chart was scribed by [employer]. The patient's care started at 20:22  CSN: [id number] Arrival date & time: 04/26/2011  7:35 PM  Chief Complaint  Patient presents with  . Medication Reaction   HPI Whitney Scott is a 25 y.o. female with a history of seizures who presents to the Emergency Department complaining of Medication Reaction.Pt relates she has been on dilantin for the past 7 years and she was having a lot of breakthrough seizures. Her dilantin was increased to 600 mg a day without improvement.AShe was started on Keppra 500 mg BID and 5 days ago it was increased to 750 mg twice a day and every since then she started feeling bad.  Reports trouble walking. States she is off balance, dizzy, shaky with associated chills.  Reports headaches with associated blurry vision, typical of pre-seizure symptoms.    Past Medical History  Diagnosis Date  . Seizures   . Anxiety     Past Surgical History  Procedure Date  . Cesarean section   . Colon surgery     Family History  Problem Relation Age of Onset  . Seizures Mother   . Diabetes Mother   . Heart failure Mother   . Cancer Father     History  Substance Use Topics  . Smoking status: Current Everyday Smoker -- 0.2 packs/day for 5 years    Types: Cigarettes  . Smokeless tobacco: Never Used  . Alcohol Use: Yes     couple of times a month  unemployed Lives with mother  OB History    Grav Para Term Preterm Abortions TAB SAB Ect Mult Living   2 2  2      2       Review of Systems  Constitutional: Positive for fever and chills.  Neurological: Positive for dizziness.  All other systems reviewed and are negative.    Allergies  Bactrim; Keflex; and Penicillins  Home Medications   Current Outpatient Rx  Name Route Sig Dispense Refill  . LEVETIRACETAM 500 MG PO TABS Oral Take 750 mg by mouth every 12 (twelve) hours.      Marland Kitchen NAPROXEN SODIUM 220  MG PO TABS Oral Take 220 mg by mouth daily as needed. For headache pain     . PHENYTOIN SODIUM EXTENDED 100 MG PO CAPS Oral Take 2 capsules (200 mg total) by mouth 3 (three) times daily. 180 capsule 0    BP 105/58  Pulse 90  Temp(Src) 98.6 F (37 C) (Oral)  Resp 18  Ht 5\' 3"  (1.6 m)  Wt 198 lb (89.812 kg)  BMI 35.07 kg/m2  SpO2 97%  LMP 04/03/2011  Physical Exam  Nursing note and vitals reviewed. Constitutional: She is oriented to person, place, and time. She appears well-developed and well-nourished.       Awake, alert, nontoxic appearance.  HENT:  Head: Normocephalic and atraumatic.  Mouth/Throat: Uvula is midline and mucous membranes are normal.  Eyes: Conjunctivae and EOM are normal. Pupils are equal, round, and reactive to light. Right eye exhibits no discharge. Left eye exhibits no discharge. Right eye exhibits no nystagmus.       No nystagymus  Neck: Normal range of motion. Neck supple. No tracheal deviation present.  Cardiovascular: Normal rate, regular rhythm and normal heart sounds.   No murmur heard. Pulmonary/Chest: Effort normal and breath sounds normal. She has no wheezes. She has no rales. She exhibits no tenderness.  Abdominal: Soft. Bowel sounds are normal. There is no tenderness. There is no rebound and no guarding.  Musculoskeletal: She exhibits no tenderness.       Baseline ROM, no obvious new focal weakness.  No tremors  Neurological: She is alert and oriented to person, place, and time. No cranial nerve deficit.       Mental status and motor strength appears baseline for patient and situation.  Skin: Skin is warm and dry. No rash noted. No erythema.  Psychiatric: Her affect is blunt. She is slowed.       Speaks quietly, no tremor noted    ED Course  Procedures   DIAGNOSTIC STUDIES: Oxygen Saturation is 97% on room air, normal by my interpretation.    Results for orders placed during the hospital encounter of 04/26/11  PHENYTOIN LEVEL, TOTAL       Component Value Range   Phenytoin Lvl 7.8 (*) 10.0 - 20.0 (ug/mL)  CBC      Component Value Range   WBC 11.8 (*) 4.0 - 10.5 (K/uL)   RBC 4.70  3.87 - 5.11 (MIL/uL)   Hemoglobin 14.8  12.0 - 15.0 (g/dL)   HCT 16.1  09.6 - 04.5 (%)   MCV 90.4  78.0 - 100.0 (fL)   MCH 31.5  26.0 - 34.0 (pg)   MCHC 34.8  30.0 - 36.0 (g/dL)   RDW 40.9  81.1 - 91.4 (%)   Platelets 345  150 - 400 (K/uL)  COMPREHENSIVE METABOLIC PANEL      Component Value Range   Sodium 138  135 - 145 (mEq/L)   Potassium 3.5  3.5 - 5.1 (mEq/L)   Chloride 105  96 - 112 (mEq/L)   CO2 22  19 - 32 (mEq/L)   Glucose, Bld 83  70 - 99 (mg/dL)   BUN 12  6 - 23 (mg/dL)   Creatinine, Ser 7.82  0.50 - 1.10 (mg/dL)   Calcium 9.0  8.4 - 95.6 (mg/dL)   Total Protein 7.0  6.0 - 8.3 (g/dL)   Albumin 4.0  3.5 - 5.2 (g/dL)   AST 23  0 - 37 (U/L)   ALT 44 (*) 0 - 35 (U/L)   Alkaline Phosphatase 109  39 - 117 (U/L)   Total Bilirubin 0.2 (*) 0.3 - 1.2 (mg/dL)   GFR calc non Af Amer >90  >90 (mL/min)   GFR calc Af Amer >90  >90 (mL/min)   Laboratory interpretation no acute changes, mild leukocytosis   COORDINATION OF CARE: 20:53 - EDP examined patient at bedside. Ordered blood work, benadryl and zofran 23:23 pt sleeping, does not express any concerns.   Pt advised to drop her keppra back down to 500 mg twice a day until she sees Dr Gerilyn Pilgrim on the 17th.   I personally performed the services described in this documentation, which was scribed in my presence. The recorded information has been reviewed and considered. Devoria Albe, MD, Armando Gang   Ward Givens, MD 04/26/11 [phone]

## 2011-04-26 NOTE — ED Notes (Signed)
Patient and mother c/o that patient with possible reaction to new med for seizures- mother states that pt is "out of it", pt c/o N/V, weakness, dizziness, shaky and heart racing

## 2011-04-26 NOTE — ED Notes (Signed)
Pt states feeling better than on arrival to the hospital; pt more alert not as lethargic, no c/o's noted, pt resting comfortably; neuro WNL

## 2011-04-27 LAB — BASIC METABOLIC PANEL
CO2: 25
Calcium: 9.6
GFR calc Af Amer: >60
GFR calc non Af Amer: >60
Potassium: 3.9
Sodium: 138

## 2011-04-27 LAB — DIFFERENTIAL
Eosinophils Relative: 1
Lymphocytes Relative: 18
Monocytes Absolute: 0.9 — ABNORMAL HIGH
Monocytes Relative: 7
Neutro Abs: 9.7 — ABNORMAL HIGH

## 2011-04-27 LAB — CBC
HCT: 43
Hemoglobin: 15.1 — ABNORMAL HIGH
RBC: 4.83

## 2011-04-27 LAB — URINE MICROSCOPIC-ADD ON

## 2011-04-27 LAB — URINALYSIS, ROUTINE W REFLEX MICROSCOPIC
Bilirubin Urine: NEGATIVE
Leukocytes, UA: NEGATIVE
Nitrite: NEGATIVE
Specific Gravity, Urine: 1.01
pH: 8.5 — ABNORMAL HIGH

## 2011-04-27 LAB — URINE CULTURE

## 2011-04-27 LAB — PREGNANCY, URINE: Preg Test, Ur: NEGATIVE

## 2011-05-12 ENCOUNTER — Encounter (HOSPITAL_COMMUNITY): Payer: Self-pay

## 2011-05-12 ENCOUNTER — Emergency Department (HOSPITAL_COMMUNITY)
Admission: EM | Admit: 2011-05-12 | Discharge: 2011-05-12 | Disposition: A | Discharge status: Home / Self Care | Payer: Medicaid Other | Attending: Emergency Medicine | Admitting: Emergency Medicine

## 2011-05-12 DIAGNOSIS — F17200 Nicotine dependence, unspecified, uncomplicated: Secondary | ICD-10-CM | POA: Insufficient documentation

## 2011-05-12 DIAGNOSIS — R569 Unspecified convulsions: Secondary | ICD-10-CM

## 2011-05-12 DIAGNOSIS — F411 Generalized anxiety disorder: Secondary | ICD-10-CM | POA: Insufficient documentation

## 2011-05-12 MED ORDER — METOCLOPRAMIDE HCL 5 MG/ML IJ SOLN
10.0000 mg | Freq: Once | INTRAMUSCULAR | Status: AC
  Administered 2011-05-12: 10 mg via INTRAVENOUS
  Filled 2011-05-12: qty 2

## 2011-05-12 MED ORDER — LORAZEPAM 2 MG/ML IJ SOLN
1.0000 mg | Freq: Once | INTRAMUSCULAR | Status: AC
  Administered 2011-05-12: 1 mg via INTRAVENOUS
  Filled 2011-05-12: qty 1

## 2011-05-12 MED ORDER — ONDANSETRON HCL 4 MG/2ML IJ SOLN
4.0000 mg | Freq: Once | INTRAMUSCULAR | Status: AC
  Administered 2011-05-12: 4 mg via INTRAVENOUS
  Filled 2011-05-12: qty 2

## 2011-05-12 MED ORDER — DIPHENHYDRAMINE HCL 50 MG/ML IJ SOLN
25.0000 mg | Freq: Once | INTRAMUSCULAR | Status: AC
  Administered 2011-05-12: 25 mg via INTRAVENOUS
  Filled 2011-05-12: qty 1

## 2011-05-12 NOTE — ED Provider Notes (Signed)
History   This chart was scribed for Joya Gaskins, MD by Clarita Crane. The patient was seen in room APA16A/APA16A and the patient's care was started at 5:52PM.   CSN: [id number] Arrival date & time: 05/12/2011  5:44 PM   First MD Initiated Contact with Patient 05/12/11 1744      Chief Complaint  Patient presents with  . Seizures   HPI Whitney Scott is a 25 y.o. female who presents to the Emergency Department for evaluation after having two seizures which occurred today but have resolved. Patient reports having 1 seizure this afternoon followed by drowsiness which led her to lay down and then experienced an additional seizure after awaking this afternoon. Patient now c/o moderate to severe HA and generalized weakness which she notes is typical for her following a seizure. Denies head injury, fever, incontinence, tongue biting. Reports her last seizure prior to today occurred 1 week ago. Patient is currently followed by Dr. Gerilyn Pilgrim for seizures and is on 600mg  Dilantin per day as well as a steroid medication. States she was previously on Keppra but was taken off of the medication by Dr. Gerilyn Pilgrim. Patient denies etoh use and chance of pregnancy.   Reports steroid are to "decrease swelling" and help antiepileptics "absorb in my body"  Past Medical History  Diagnosis Date  . Seizures   . Anxiety     Past Surgical History  Procedure Date  . Cesarean section   . Colon surgery     Family History  Problem Relation Age of Onset  . Seizures Mother   . Diabetes Mother   . Heart failure Mother   . Cancer Father     History  Substance Use Topics  . Smoking status: Current Everyday Smoker -- 0.2 packs/day for 5 years    Types: Cigarettes  . Smokeless tobacco: Never Used  . Alcohol Use: Yes     couple of times a month    OB History    Grav Para Term Preterm Abortions TAB SAB Ect Mult Living   2 2  2      2       Review of Systems 10 Systems reviewed and are negative for  acute change except as noted in the HPI.  Allergies  Bactrim; Keflex; Keppra; Penicillins; and Vimpat  Home Medications   Current Outpatient Rx  Name Route Sig Dispense Refill  . METHYLPREDNISOLONE 4 MG PO TABS Oral Take 4 mg by mouth as directed. 6,5,4,3,2,1 as directed by dose packaging     . PHENYTOIN SODIUM EXTENDED 100 MG PO CAPS Oral Take 2 capsules (200 mg total) by mouth 3 (three) times daily. 180 capsule 0  . LEVETIRACETAM 500 MG PO TABS Oral Take 750 mg by mouth every 12 (twelve) hours.      Marland Kitchen NAPROXEN SODIUM 220 MG PO TABS Oral Take 220 mg by mouth daily as needed. For headache pain       BP 94/49  Pulse 75  Temp(Src) 98 F (36.7 C) (Oral)  Resp 16  Ht 5\' 3"  (1.6 m)  Wt 187 lb (84.823 kg)  BMI 33.13 kg/m2  SpO2 96%  LMP 04/29/2011  Physical Exam CONSTITUTIONAL: Well developed/well nourished HEAD AND FACE: Normocephalic/atraumatic EYES: EOMI/PERRL ENMT: Mucous membranes moist, no evidence of tongue lacerations NECK: supple no meningeal signs SPINE:entire spine nontender CV: S1/S2 noted, no murmurs/rubs/gallops noted LUNGS: Lungs are clear to auscultation bilaterally, no apparent distress ABDOMEN: soft, nontender, no rebound or guarding NEURO: Pt is awake/alert, moves  all extremitiesx4, no facial droop, no pronator drift EXTREMITIES: pulses normal, full ROM SKIN: warm, color normal PSYCH: no abnormalities of mood noted  ED Course  Procedures (including critical care time)  DIAGNOSTIC STUDIES: Oxygen Saturation is 100% on room air, normal by my interpretation.    COORDINATION OF CARE: 7:56 PM-Pt stable, no sz activity here in ED. I advised need for close neuro followup as may need further outpatient therapy 8:40PM- Patient condition improved at this time and notes HA has improved significantly. Will d/c patient home.  discussed return precautions and to avoid bathing alone, avoid driving/climbing heights   Labs Reviewed  GLUCOSE, CAPILLARY - Abnormal;  Notable for the following:    Glucose-Capillary 102 (*)    All other components within normal limits  PHENYTOIN LEVEL, TOTAL  POCT CBG MONITORING     MDM  Nursing notes reviewed and considered in documentation All labs/vitals reviewed and considered  8:44 PM BP 97/53  Pulse 79  Temp(Src) 98 F (36.7 C) (Oral)  Resp 20  Ht 5\' 3"  (1.6 m)  Wt 187 lb (84.823 kg)  BMI 33.13 kg/m2  SpO2 94%  LMP 04/29/2011   I personally performed the services described in this documentation, which was scribed in my presence. The recorded information has been reviewed and considered.     Joya Gaskins, MD 05/12/11 2044

## 2011-05-12 NOTE — ED Notes (Signed)
Pt states "had 2 seizures today" un witnessed. Pt has history of seizures and is currently prescribed dilantin with no new medication started.  Pt states takes medication as prescribed. C/O headache and nausea at this time.

## 2011-05-12 NOTE — ED Notes (Signed)
Pt presents after having 2 seizures. Pt states prior to today her last seizure was approx 1 week ago. Pt a/ox4. Resp even and unlabored. NAD at this time.

## 2011-05-24 ENCOUNTER — Other Ambulatory Visit: Payer: Self-pay | Admitting: Neurology

## 2011-05-24 DIAGNOSIS — R569 Unspecified convulsions: Secondary | ICD-10-CM

## 2011-05-30 ENCOUNTER — Ambulatory Visit (HOSPITAL_COMMUNITY): Admission: RE | Admit: 2011-05-30 | Payer: Medicaid Other | Source: Ambulatory Visit

## 2011-05-30 ENCOUNTER — Ambulatory Visit (HOSPITAL_COMMUNITY)
Admission: RE | Admit: 2011-05-30 | Discharge: 2011-05-30 | Disposition: A | Discharge status: Home / Self Care | Payer: Medicaid Other | Source: Ambulatory Visit | Attending: Neurology | Admitting: Neurology

## 2011-05-30 ENCOUNTER — Other Ambulatory Visit: Payer: Self-pay | Admitting: Neurology

## 2011-05-30 DIAGNOSIS — R569 Unspecified convulsions: Secondary | ICD-10-CM

## 2011-05-30 MED ORDER — GADOBENATE DIMEGLUMINE 529 MG/ML IV SOLN
17.0000 mL | Freq: Once | INTRAVENOUS | Status: AC | PRN
  Administered 2011-05-30: 17 mL via INTRAVENOUS

## 2011-06-28 ENCOUNTER — Emergency Department (HOSPITAL_COMMUNITY)
Admission: EM | Admit: 2011-06-28 | Discharge: 2011-06-28 | Disposition: A | Discharge status: Home / Self Care | Payer: Medicaid Other | Attending: Emergency Medicine | Admitting: Emergency Medicine

## 2011-06-28 ENCOUNTER — Encounter (HOSPITAL_COMMUNITY): Payer: Self-pay | Admitting: Emergency Medicine

## 2011-06-28 DIAGNOSIS — R569 Unspecified convulsions: Secondary | ICD-10-CM | POA: Insufficient documentation

## 2011-06-28 DIAGNOSIS — F17200 Nicotine dependence, unspecified, uncomplicated: Secondary | ICD-10-CM | POA: Insufficient documentation

## 2011-06-28 DIAGNOSIS — H18832 Recurrent erosion of cornea, left eye: Secondary | ICD-10-CM

## 2011-06-28 DIAGNOSIS — H18839 Recurrent erosion of cornea, unspecified eye: Secondary | ICD-10-CM | POA: Insufficient documentation

## 2011-06-28 MED ORDER — OXYCODONE-ACETAMINOPHEN 5-325 MG PO TABS: 1.0000 | ORAL_TABLET | ORAL | Status: AC | PRN

## 2011-06-28 MED ORDER — TETRACAINE HCL 0.5 % OP SOLN
OPHTHALMIC | Status: AC
  Administered 2011-06-28: 21:00:00
  Filled 2011-06-28: qty 2

## 2011-06-28 MED ORDER — ERYTHROMYCIN 5 MG/GM OP OINT
TOPICAL_OINTMENT | Freq: Once | OPHTHALMIC | Status: AC
  Administered 2011-06-28: 21:00:00 via OPHTHALMIC
  Filled 2011-06-28: qty 3.5

## 2011-06-28 NOTE — ED Notes (Signed)
Patient states she had a seizure 4 months ago and hit her left eye. States "every now and then, my eye flares up like this and it feels like something is scratching my eye." Started today upon awakening. Denies recent injury. Visual acuity 20/100 in left eye and 20/20 in right eye at triage.

## 2011-06-28 NOTE — ED Notes (Signed)
Left in c/o spouse for transport home; a&ox4; instructions given and reviewed; verbalizes understanding.

## 2011-06-28 NOTE — ED Provider Notes (Signed)
  I performed a history and physical examination of Whitney Scott and discussed her management with Dr. Yaakov Guthrie.  I agree with the history, physical, assessment, and plan of care, with the following exceptions: None  I was present for the following procedures: None Time Spent in Critical Care of the patient: None  Manus Rudd, MD 06/28/11 2157

## 2011-06-28 NOTE — ED Provider Notes (Signed)
History     CSN: [id number] Arrival date & time: 06/28/2011  8:14 PM   First MD Initiated Contact with Patient 06/28/11 1955      Chief Complaint  Patient presents with  . Eye Pain    (Consider location/radiation/quality/duration/timing/severity/associated sxs/prior treatment) HPI Whitney Scott is a 25 y/o with history of seizures who presents with L eye pain that started this morning.  The pain started as soon as she woke up.  She has had clear drainage and is very sensitive to light.    She has had four similar episodes in the past 4 months.  The past three all resolved in a few hours, but this one is worse.  The problem started four months ago when she hit her L eye during a seizure.  She was told that she scratched the eye, and it took two weeks to get better.  She never saw an eye doctor for it, and she has not seen an eye doctor for her whole life.    Normally, she feels that she sees equally well out of both eyes.     Past Medical History  Diagnosis Date  . Seizures   . Anxiety     Past Surgical History  Procedure Date  . Cesarean section   . Colon surgery     Family History  Problem Relation Age of Onset  . Seizures Mother   . Diabetes Mother   . Heart failure Mother   . Cancer Father     History  Substance Use Topics  . Smoking status: Current Everyday Smoker -- 0.2 packs/day for 5 years    Types: Cigarettes  . Smokeless tobacco: Never Used  . Alcohol Use: Yes     couple of times a month     Review of Systems  No headache.  No fevers or chills.  No purulent drainage.  Does not wear contacts.   Allergies  Bactrim; Keflex; Keppra; Penicillins; and Vimpat  Home Medications   Current Outpatient Rx  Name Route Sig Dispense Refill  . LEVETIRACETAM 500 MG PO TABS Oral Take 750 mg by mouth every 12 (twelve) hours.      . METHYLPREDNISOLONE 4 MG PO TABS Oral Take 4 mg by mouth as directed. 6,5,4,3,2,1 as directed by dose packaging     . NAPROXEN SODIUM  220 MG PO TABS Oral Take 220 mg by mouth daily as needed. For headache pain     . PHENYTOIN SODIUM EXTENDED 100 MG PO CAPS Oral Take 2 capsules (200 mg total) by mouth 3 (three) times daily. 180 capsule 0    BP 94/60  Pulse 82  Temp(Src) 97.4 F (36.3 C) (Oral)  Resp 16  Ht 5\' 3"  (1.6 m)  Wt 189 lb (85.73 kg)  BMI 33.48 kg/m2  SpO2 97%  LMP 06/28/2011  Physical Exam  VA: 20/20 OD 20/100 OS  IOP deferred as to not exacerbate corneal injury  OD exam:  Lids and lashes clean and calm.  No conjunctival injection.  Cornea clear.  No cell or flare in anterior chamber  OS exam: Mild erythema under lower lid.  Mild conjunctival injection.  Large medial-central epithelial lesion on cornea that stains with fluorescein.  No purulent material present.  Does not appear to extend into stroma.  No definite stromal scar.  No definite cell or flare in anterior chamber.  Significant improvement of L eye pain with application of tetracaine drops   ED Course  Procedures (including critical care time)  Significant improvement of L eye pain with application of tetracaine drops  1. Recurrent erosion of left cornea       MDM  I discussed all aspects of this case with my attending Dr. Fredricka Bonine, who agreed with the entire assessment and plan.    Plan: Erythromycin ointment in L eye four times daily for 5 days.  Then use once every night before bed until the tube is gone. Long acting lubricating eye ointment (Lacrilube or Polyvisc) in L eye once at night until she sees an ophthalmologist once the erythromycin tube is gone. Artificial tears PRN for dry or gritty feeling eyes Percocet 5/325mg  Q4HPRN for pain for the next couple days, #10 tablets total Ophthalmology follow-up to see if she is a candidate for a permanent solution to this recurrent problem        Blanca Friend, MD 06/28/11 2147

## 2012-03-30 ENCOUNTER — Encounter (HOSPITAL_COMMUNITY): Payer: Self-pay | Admitting: Emergency Medicine

## 2012-03-30 ENCOUNTER — Emergency Department (HOSPITAL_COMMUNITY)
Admission: EM | Admit: 2012-03-30 | Discharge: 2012-03-30 | Disposition: A | Discharge status: Home / Self Care | Payer: Medicaid Other | Attending: Emergency Medicine | Admitting: Emergency Medicine

## 2012-03-30 DIAGNOSIS — R51 Headache: Secondary | ICD-10-CM

## 2012-03-30 DIAGNOSIS — R569 Unspecified convulsions: Secondary | ICD-10-CM | POA: Insufficient documentation

## 2012-03-30 DIAGNOSIS — R7989 Other specified abnormal findings of blood chemistry: Secondary | ICD-10-CM | POA: Insufficient documentation

## 2012-03-30 DIAGNOSIS — R7889 Finding of other specified substances, not normally found in blood: Secondary | ICD-10-CM

## 2012-03-30 DIAGNOSIS — F17200 Nicotine dependence, unspecified, uncomplicated: Secondary | ICD-10-CM | POA: Insufficient documentation

## 2012-03-30 DIAGNOSIS — F411 Generalized anxiety disorder: Secondary | ICD-10-CM | POA: Insufficient documentation

## 2012-03-30 LAB — PHENYTOIN LEVEL, TOTAL: Phenytoin Lvl: 6.6 ug/mL — ABNORMAL LOW (ref 10.0–20.0)

## 2012-03-30 MED ORDER — PHENYTOIN SODIUM 50 MG/ML IJ SOLN
INTRAMUSCULAR | Status: AC
  Filled 2012-03-30: qty 10

## 2012-03-30 MED ORDER — METOCLOPRAMIDE HCL 5 MG/ML IJ SOLN
10.0000 mg | Freq: Once | INTRAMUSCULAR | Status: AC
  Administered 2012-03-30: 10 mg via INTRAVENOUS
  Filled 2012-03-30: qty 2

## 2012-03-30 MED ORDER — SODIUM CHLORIDE 0.9 % IV SOLN
500.0000 mg | Freq: Once | INTRAVENOUS | Status: AC
  Administered 2012-03-30: 500 mg via INTRAVENOUS
  Filled 2012-03-30: qty 10

## 2012-03-30 MED ORDER — SODIUM CHLORIDE 0.9 % IV SOLN
1000.0000 mL | INTRAVENOUS | Status: DC
  Administered 2012-03-30: 1000 mL via INTRAVENOUS

## 2012-03-30 MED ORDER — KETOROLAC TROMETHAMINE 30 MG/ML IJ SOLN
30.0000 mg | Freq: Once | INTRAMUSCULAR | Status: AC
  Administered 2012-03-30: 30 mg via INTRAVENOUS
  Filled 2012-03-30: qty 1

## 2012-03-30 MED ORDER — SODIUM CHLORIDE 0.9 % IV SOLN
1000.0000 mL | Freq: Once | INTRAVENOUS | Status: AC
  Administered 2012-03-30: 1000 mL via INTRAVENOUS

## 2012-03-30 MED ORDER — DIPHENHYDRAMINE HCL 50 MG/ML IJ SOLN
25.0000 mg | Freq: Once | INTRAMUSCULAR | Status: AC
  Administered 2012-03-30: 25 mg via INTRAVENOUS
  Filled 2012-03-30: qty 1

## 2012-03-30 NOTE — ED Notes (Signed)
Per pt mom, pt has a hx of seizures. Pt took a nap from 1230-1500. Pt's mom states that "I think she had a seizure in her sleep. " Pt is alert and oriented when first roomed in ED. Pt is nauseated and pt is nodding off during assessment. Pt is complaining of a headache. Pt's mom states "this is not normal for her." upon assessment v/s within nomal, pt in NSR on monitor. Pt has recently been switched from name brand dilantin and is now on the generic form. "Seizures" have been happening more frequently ever since, per pt mom.

## 2012-03-30 NOTE — ED Notes (Signed)
Patient states that she has seizures in her sleep, states she had a seizure today and now has a terrible headache.  Mother reports that she has been taking the generic form of her anti seizure medication which does not seem to control her seizures and her doctor just switched her back to the name brand of dilantin.

## 2012-03-30 NOTE — ED Provider Notes (Cosign Needed)
History   This chart was scribed for Ward Givens, MD scribed by Magnus Sinning. The patient was seen in room APA04/APA04 at 18:46   CSN: [id number]  Arrival date & time 03/30/12  1824      Chief Complaint  Patient presents with  . Headache  . Generalized Body Aches    (Consider location/radiation/quality/duration/timing/severity/associated sxs/prior treatment) The history is provided by the patient. No language interpreter was used.   Whitney Scott is a 26 y.o. female who presents to the Emergency Department complaining of an un-witnessed seizure with associated constant throbbing HA all over, double vision and nausea. Patient mother states the pt has seizures in her sleep. She explains the pt was in the bed and that she attempted to get her up approximately 4 hours ago, but explains the patient stated she couldn't get her eyes open.  Mother states once the patient got up, she would nod off and was exhibiting un-arousable behavior. State she was stumbllng when walking.The mother says her sxs have never been this bad says and double vision is  typical of her hx of seizures. The patient's mother  says the pt's last seizure was 9 months ago. She explains she was recently switched to brand name Dilantin because the doctor suspected more occurences of "break-through" seizures with the generic brand. However she has run out of her dilantin and has found some old generic dilantin that she has been taking. MOP states neither her PCP or her neurologist would refill her dilantin. But she was called last night that she had a prescription ready at [facility]. PT has nausea without vomiting. Her headaches are typical after having a seizure.  Patient has a two year old, who she was up late last night with from 1-5 am, however the mother says she was fine both this morning and all day yesterday.  Pt denies vomiting, smoking, etoh use and is currently unemployed.   PCP: [facility]  Family  Neurologist: Dr. Gerilyn Pilgrim   Past Medical History  Diagnosis Date  . Seizures   . Anxiety     Past Surgical History  Procedure Date  . Cesarean section   . Colon surgery     Family History  Problem Relation Age of Onset  . Seizures Mother   . Diabetes Mother   . Heart failure Mother   . Cancer Father     History  Substance Use Topics  . Smoking status: Current Every Day Smoker -- 0.2 packs/day for 5 years    Types: Cigarettes  . Smokeless tobacco: Never Used  . Alcohol Use: Yes     Comment: couple of times a month  MOP states she smokes cigars unemploye  OB History    Grav Para Term Preterm Abortions TAB SAB Ect Mult Living   2 2  2      2       Review of Systems  10 Systems reviewed and are negative for acute change except as noted in the HPI.  Allergies  Bactrim; Cephalexin; Keppra; Penicillins; and Vimpat  Home Medications   Current Outpatient Rx  Name Route Sig Dispense Refill  . ACETAMINOPHEN 500 MG PO TABS Oral Take 500 mg by mouth as needed. For pain     . PHENYTOIN SODIUM EXTENDED 100 MG PO CAPS Oral Take 200-300 mg by mouth 2 (two) times daily. Take three capsules (300mg ) every day in the morning and two capsules (200mg ) at bedtime  BP 103/67  Pulse 77  Resp 16  Ht 5\' 3"  (1.6 m)  Wt 190 lb (86.183 kg)  BMI 33.66 kg/m2  SpO2 96%  Vital signs normal    Physical Exam  Nursing note and vitals reviewed. Constitutional: She is oriented to person, place, and time. She appears well-developed and well-nourished. No distress.       Sleepy, talks quietly  HENT:  Head: Normocephalic and atraumatic.  Right Ear: External ear normal.  Left Ear: External ear normal.  Nose: Nose normal.  Mouth/Throat: Oropharynx is clear and moist.       No trauma to her tongue  Eyes: Conjunctivae normal and EOM are normal. Pupils are equal, round, and reactive to light.  Neck: Normal range of motion. Neck supple. No tracheal deviation present.   Cardiovascular: Normal rate, regular rhythm and normal heart sounds.   Pulmonary/Chest: Effort normal. No respiratory distress.  Abdominal: Soft. She exhibits no distension.  Musculoskeletal: Normal range of motion. She exhibits no edema.  Neurological: She is alert and oriented to person, place, and time. No sensory deficit.  Skin: Skin is warm and dry.  Psychiatric: She has a normal mood and affect. Her behavior is normal.       Flat affect    ED Course  Procedures (including critical care time) DIAGNOSTIC STUDIES: Oxygen Saturation is 96% on room air, normal by my interpretation.    COORDINATION OF CARE: Pt given IV benadryl, reglan for her headache.   20:42: Notified pt that dilantin level was low. Patient notes mild improvement. MOP relates they have her new bottle of dilantin and patient started taking it today.  PT given 1/2 bolus of dilantin and given toradol for lingering HA.   22:11: Rechecked performed. Patient notes significant improvement. Alert and oriented. Feels ready to go home.   Medications administered in the ED  Medications  0.9 %  sodium chloride infusion (0 mL Intravenous Stopped 03/30/12 2000)    Followed by  0.9 %  sodium chloride infusion (0 mL Intravenous Stopped 03/30/12 2221)  diphenhydrAMINE (BENADRYL) injection 25 mg (25 mg Intravenous Given 03/30/12 1918)  metoCLOPramide (REGLAN) injection 10 mg (10 mg Intravenous Given 03/30/12 1918)  phenytoin (DILANTIN) 500 mg in sodium chloride 0.9 % 100 mL IVPB (0 mg Intravenous Stopped 03/30/12 2145)  ketorolac (TORADOL) 30 MG/ML injection 30 mg (30 mg Intravenous Given 03/30/12 2049)      Labs  Results for orders placed during the hospital encounter of 03/30/12  PHENYTOIN LEVEL, TOTAL      Component Value Range   Phenytoin Lvl 6.6 (*) 10.0 - 20.0 ug/mL   subtherapeutic dilantin level     1. Seizure   2. Subtherapeutic serum dilantin level   3. Headache     Plan discharge  Devoria Albe, MD,  FACEP   MDM   I personally performed the services described in this documentation, which was scribed in my presence. The recorded information has been reviewed and considered.  Devoria Albe, MD, FACEP         Ward Givens, MD 03/30/12 5621  Ward Givens, MD 05/25/12 [phone]

## 2012-04-01 ENCOUNTER — Emergency Department (HOSPITAL_COMMUNITY)
Admission: EM | Admit: 2012-04-01 | Discharge: 2012-04-02 | Disposition: A | Discharge status: Home / Self Care | Payer: Medicaid Other | Attending: Emergency Medicine | Admitting: Emergency Medicine

## 2012-04-01 DIAGNOSIS — X58XXXA Exposure to other specified factors, initial encounter: Secondary | ICD-10-CM | POA: Insufficient documentation

## 2012-04-01 DIAGNOSIS — Z833 Family history of diabetes mellitus: Secondary | ICD-10-CM | POA: Insufficient documentation

## 2012-04-01 DIAGNOSIS — Z881 Allergy status to other antibiotic agents status: Secondary | ICD-10-CM | POA: Insufficient documentation

## 2012-04-01 DIAGNOSIS — S058X9A Other injuries of unspecified eye and orbit, initial encounter: Secondary | ICD-10-CM | POA: Insufficient documentation

## 2012-04-01 DIAGNOSIS — Z809 Family history of malignant neoplasm, unspecified: Secondary | ICD-10-CM | POA: Insufficient documentation

## 2012-04-01 DIAGNOSIS — Z880 Allergy status to penicillin: Secondary | ICD-10-CM | POA: Insufficient documentation

## 2012-04-01 DIAGNOSIS — F411 Generalized anxiety disorder: Secondary | ICD-10-CM | POA: Insufficient documentation

## 2012-04-01 DIAGNOSIS — Z8249 Family history of ischemic heart disease and other diseases of the circulatory system: Secondary | ICD-10-CM | POA: Insufficient documentation

## 2012-04-01 DIAGNOSIS — Z888 Allergy status to other drugs, medicaments and biological substances status: Secondary | ICD-10-CM | POA: Insufficient documentation

## 2012-04-01 DIAGNOSIS — S0500XA Injury of conjunctiva and corneal abrasion without foreign body, unspecified eye, initial encounter: Secondary | ICD-10-CM

## 2012-04-01 DIAGNOSIS — F17200 Nicotine dependence, unspecified, uncomplicated: Secondary | ICD-10-CM | POA: Insufficient documentation

## 2012-04-02 ENCOUNTER — Encounter (HOSPITAL_COMMUNITY): Payer: Self-pay | Admitting: *Deleted

## 2012-04-02 MED ORDER — CIPROFLOXACIN HCL 0.3 % OP SOLN
OPHTHALMIC | Status: AC
  Filled 2012-04-02: qty 2.5

## 2012-04-02 MED ORDER — TETRACAINE HCL 0.5 % OP SOLN
OPHTHALMIC | Status: AC
  Administered 2012-04-02: 02:00:00
  Filled 2012-04-02: qty 2

## 2012-04-02 MED ORDER — CIPROFLOXACIN HCL 0.3 % OP SOLN
1.0000 [drp] | Freq: Once | OPHTHALMIC | Status: AC
  Administered 2012-04-02: 1 [drp] via OPHTHALMIC
  Filled 2012-04-02: qty 2.5

## 2012-04-02 MED ORDER — OXYCODONE-ACETAMINOPHEN 5-325 MG PO TABS
2.0000 | ORAL_TABLET | Freq: Once | ORAL | Status: AC
  Administered 2012-04-02: 2 via ORAL
  Filled 2012-04-02: qty 2

## 2012-04-02 MED ORDER — OXYCODONE-ACETAMINOPHEN 5-325 MG PO TABS: 1.0000 | ORAL_TABLET | ORAL | Status: DC | PRN

## 2012-04-02 NOTE — ED Notes (Signed)
Patient  Has left pain. States its killing her. Has seizure 2 days ago and was seen here in er

## 2012-04-02 NOTE — ED Notes (Signed)
Unable to obtain visual acuity. Patient states it hurts to open her eyes

## 2012-04-02 NOTE — ED Provider Notes (Signed)
History     CSN: [id number]  Arrival date & time 04/01/12  2349   First MD Initiated Contact with Patient 04/02/12 [phone]      Chief Complaint  Patient presents with  . Eye Pain    (Consider location/radiation/quality/duration/timing/severity/associated sxs/prior treatment) HPI Comments: 26 year old female with a history of seizures who had a seizure several days ago, was seen in the emergency department according to the patientat that time had no complaints of eye problems.today she states that shortly after leaving the hospital she developed eye pain in the left eye, has had persistent pain associated with redness and watering which is gradually worsening, associated with blurred vision in the left eye, constant. She denies rubbing her eyes, denies fever, and states that the pain is severe. She is unable to open the eye because of severe photophobia.  Patient is a 26 y.o. female presenting with eye pain. The history is provided by the patient and a parent.  Eye Pain    Past Medical History  Diagnosis Date  . Seizures   . Anxiety     Past Surgical History  Procedure Date  . Cesarean section   . Colon surgery     Family History  Problem Relation Age of Onset  . Seizures Mother   . Diabetes Mother   . Heart failure Mother   . Cancer Father     History  Substance Use Topics  . Smoking status: Current Every Day Smoker -- 0.2 packs/day for 5 years    Types: Cigarettes  . Smokeless tobacco: Never Used  . Alcohol Use: Yes     couple of times a month    OB History    Grav Para Term Preterm Abortions TAB SAB Ect Mult Living   2 2  2      2       Review of Systems  Constitutional: Negative for fever.  Eyes: Positive for pain and discharge (watery).    Allergies  Bactrim; Cephalexin; Keppra; Penicillins; and Vimpat  Home Medications   Current Outpatient Rx  Name Route Sig Dispense Refill  . ACETAMINOPHEN 500 MG PO TABS Oral Take 500 mg by mouth every 6 (six)  hours as needed. For pain    . DULOXETINE HCL 30 MG PO CPEP Oral Take 30 mg by mouth daily.    . OXYCODONE-ACETAMINOPHEN 5-325 MG PO TABS Oral Take 1 tablet by mouth every 4 (four) hours as needed for pain. 20 tablet 0  . PHENYTOIN SODIUM EXTENDED 100 MG PO CAPS Oral Take 200-300 mg by mouth 2 (two) times daily. Take three capsules (300mg ) every day in the morning and two capsules (200mg ) at bedtime     . PHENYTOIN SODIUM EXTENDED 100 MG PO CAPS Oral Take 200-300 mg by mouth 2 (two) times daily. Patient takes 3 capsules in the morning and 2 capsules at bedtime      BP 116/73  Pulse 95  Temp 98.9 F (37.2 C) (Oral)  Resp 20  Ht 5\' 3"  (1.6 m)  Wt 208 lb (94.348 kg)  BMI 36.85 kg/m2  SpO2 98%  Physical Exam  Nursing note and vitals reviewed. Constitutional: She appears well-developed and well-nourished.       Uncomfortable appearing  HENT:  Head: Normocephalic and atraumatic.  Eyes: Left eye exhibits discharge (watery).       Right eye appears normal, normal pupillary exam, left eye has diffuse conjunctival injection and under fluorescein and tetracaine exam has a central corneal abrasion.  Neck:  Normal range of motion. Neck supple.  Neurological: She is alert. Coordination normal.       Normal gait speech and coordination  Skin: Skin is warm and dry. No rash noted.    ED Course  Procedures (including critical care time)  Labs Reviewed - No data to display No results found.   1. Corneal abrasion       MDM  The patient has normal pupillary exam bilaterally but does have photophobia and conjunctival injection with a corneal abrasion requiring antibiotic treatment and ophthalmologic followup, Ciloxan drops given, Percocet, home with same.  Discharge Prescriptions include:  ciloxan (given in ED) Percocet        Vida Roller, MD 04/02/12 [phone]

## 2012-10-05 ENCOUNTER — Encounter (HOSPITAL_COMMUNITY): Payer: Self-pay

## 2012-10-05 ENCOUNTER — Emergency Department (HOSPITAL_COMMUNITY): Payer: Self-pay

## 2012-10-05 ENCOUNTER — Emergency Department (HOSPITAL_COMMUNITY)
Admission: EM | Admit: 2012-10-05 | Discharge: 2012-10-05 | Disposition: A | Discharge status: Home / Self Care | Payer: Self-pay | Attending: Emergency Medicine | Admitting: Emergency Medicine

## 2012-10-05 DIAGNOSIS — R42 Dizziness and giddiness: Secondary | ICD-10-CM | POA: Insufficient documentation

## 2012-10-05 DIAGNOSIS — F411 Generalized anxiety disorder: Secondary | ICD-10-CM | POA: Insufficient documentation

## 2012-10-05 DIAGNOSIS — R0602 Shortness of breath: Secondary | ICD-10-CM | POA: Insufficient documentation

## 2012-10-05 DIAGNOSIS — F17200 Nicotine dependence, unspecified, uncomplicated: Secondary | ICD-10-CM | POA: Insufficient documentation

## 2012-10-05 DIAGNOSIS — R209 Unspecified disturbances of skin sensation: Secondary | ICD-10-CM | POA: Insufficient documentation

## 2012-10-05 DIAGNOSIS — R110 Nausea: Secondary | ICD-10-CM | POA: Insufficient documentation

## 2012-10-05 DIAGNOSIS — G40909 Epilepsy, unspecified, not intractable, without status epilepticus: Secondary | ICD-10-CM | POA: Insufficient documentation

## 2012-10-05 DIAGNOSIS — Z79899 Other long term (current) drug therapy: Secondary | ICD-10-CM | POA: Insufficient documentation

## 2012-10-05 DIAGNOSIS — M542 Cervicalgia: Secondary | ICD-10-CM

## 2012-10-05 DIAGNOSIS — R200 Anesthesia of skin: Secondary | ICD-10-CM

## 2012-10-05 DIAGNOSIS — R51 Headache: Secondary | ICD-10-CM | POA: Insufficient documentation

## 2012-10-05 DIAGNOSIS — R079 Chest pain, unspecified: Secondary | ICD-10-CM

## 2012-10-05 DIAGNOSIS — R071 Chest pain on breathing: Secondary | ICD-10-CM | POA: Insufficient documentation

## 2012-10-05 LAB — BASIC METABOLIC PANEL
BUN: 9 mg/dL (ref 6–23)
Chloride: 100 mEq/L (ref 96–112)
GFR calc Af Amer: >90 mL/min (ref >90)
GFR calc non Af Amer: >90 mL/min (ref >90)
Potassium: 3.6 mEq/L (ref 3.5–5.1)
Sodium: 135 mEq/L (ref 135–145)

## 2012-10-05 LAB — CBC WITH DIFFERENTIAL/PLATELET
Basophils Relative: 0 % (ref 0–1)
Hemoglobin: 15.2 g/dL — ABNORMAL HIGH (ref 12.0–15.0)
Lymphocytes Relative: 36 % (ref 12–46)
MCHC: 35.3 g/dL (ref 30.0–36.0)
Monocytes Relative: 5 % (ref 3–12)
Neutro Abs: 5.5 10*3/uL (ref 1.7–7.7)
Neutrophils Relative %: 58 % (ref 43–77)
RBC: 4.89 MIL/uL (ref 3.87–5.11)
WBC: 9.6 10*3/uL (ref 4.0–10.5)

## 2012-10-05 MED ORDER — OXYCODONE-ACETAMINOPHEN 5-325 MG PO TABS
2.0000 | ORAL_TABLET | Freq: Once | ORAL | Status: AC
  Administered 2012-10-05: 2 via ORAL
  Filled 2012-10-05: qty 2

## 2012-10-05 MED ORDER — LORAZEPAM 2 MG/ML IJ SOLN
1.0000 mg | Freq: Once | INTRAMUSCULAR | Status: AC
  Administered 2012-10-05: 1 mg via INTRAVENOUS
  Filled 2012-10-05: qty 1

## 2012-10-05 MED ORDER — OXYCODONE-ACETAMINOPHEN 5-325 MG PO TABS: 1.0000 | ORAL_TABLET | Freq: Three times a day (TID) | ORAL | Status: DC | PRN

## 2012-10-05 NOTE — ED Notes (Signed)
Patient states that she was sitting watching the game on TV and she started having pain in her neck, then it moved to her right arm, right side of chest, and shoulders. States that her lips and feet became numb.

## 2012-10-05 NOTE — ED Notes (Signed)
Pt complaining of back pain, chest pain, headache, & numbness in hands & feet. Pt states was watching game when started, pt states it does not feel like anxiety.

## 2012-10-05 NOTE — ED Provider Notes (Signed)
History     CSN: [id number]  Arrival date & time 10/05/12  0016   First MD Initiated Contact with Patient 10/05/12 [phone]      Chief Complaint  Patient presents with  . Numbness  . Nausea     The history is provided by the patient.  bilateral numbness Onset - several hours ago Course - worsening Worsened by - nothing Improved by - nothing  Pt presents from home for multiple complaints She reports she was watching basketball when she started to have neck soreness, numbness in bilateral hands/feet and her lips were "Tingling" She also noticed chest wall pain (right sided) with SOB - she denies syncope No focal weakness No vomiting.  No abdominal pain.  No dysuria She reports mild chronic headache that is unchanged No new visual changes No h/o CVA.  No h/o DVT/PE She reports h/o seizures and is compliant with PTN.  She reports no seizures in past year She reports this is not like her typical anxiety She had otherwise been well and was not stressed prior to this episode No new medications reported No falls or trauma reported Past Medical History  Diagnosis Date  . Seizures   . Anxiety     Past Surgical History  Procedure Laterality Date  . Cesarean section    . Colon surgery      Family History  Problem Relation Age of Onset  . Seizures Mother   . Diabetes Mother   . Heart failure Mother   . Cancer Father     History  Substance Use Topics  . Smoking status: Current Every Day Smoker -- 0.25 packs/day for 5 years    Types: Cigarettes  . Smokeless tobacco: Never Used  . Alcohol Use: Yes     Comment: couple of times a month    OB History   Grav Para Term Preterm Abortions TAB SAB Ect Mult Living   2 2  2      2       Review of Systems  Constitutional: Negative for fever and diaphoresis.  HENT: Negative for neck stiffness and voice change.   Eyes: Negative for visual disturbance.  Respiratory: Positive for shortness of breath. Negative for cough.    Cardiovascular: Positive for chest pain. Negative for leg swelling.  Gastrointestinal: Negative for vomiting and abdominal pain.  Genitourinary: Negative for dysuria.  Neurological: Positive for light-headedness, numbness and headaches. Negative for seizures, syncope, facial asymmetry, speech difficulty and weakness.  Psychiatric/Behavioral: Negative for agitation.  All other systems reviewed and are negative.    Allergies  Bactrim; Cephalexin; Keppra; Penicillins; and Vimpat  Home Medications   Current Outpatient Rx  Name  Route  Sig  Dispense  Refill  . acetaminophen (TYLENOL) 500 MG tablet   Oral   Take 500 mg by mouth every 6 (six) hours as needed. For pain         . phenytoin (DILANTIN) 100 MG ER capsule   Oral   Take 200-300 mg by mouth 2 (two) times daily. Take three capsules (300mg ) every day in the morning and two capsules (200mg ) at bedtime          . phenytoin (DILANTIN) 100 MG ER capsule   Oral   Take 200-300 mg by mouth 2 (two) times daily. Patient takes 3 capsules in the morning and 2 capsules at bedtime         . DULoxetine (CYMBALTA) 30 MG capsule   Oral   Take 30 mg by  mouth daily.         Marland Kitchen oxyCODONE-acetaminophen (PERCOCET) 5-325 MG per tablet   Oral   Take 1 tablet by mouth every 4 (four) hours as needed for pain.   20 tablet   0     BP 127/72  Pulse 70  Temp(Src) 98.4 F (36.9 C) (Oral)  Resp 16  Ht 5\' 3"  (1.6 m)  Wt 193 lb (87.544 kg)  BMI 34.2 kg/m2  SpO2 97%  LMP 09/29/2012 BP 113/76  Pulse 93  Temp(Src) 98.4 F (36.9 C) (Oral)  Resp 18  Ht 5\' 3"  (1.6 m)  Wt 193 lb (87.544 kg)  BMI 34.2 kg/m2  SpO2 97%  LMP 09/29/2012  Physical Exam CONSTITUTIONAL: Well developed/well nourished HEAD: Normocephalic/atraumatic EYES: EOMI/PERRL, no nystagmus ENMT: Mucous membranes moist, no cyanosis noted to lips NECK: supple no meningeal signs, no bruits SPINE:entire spine nontender CV: S1/S2 noted, no murmurs/rubs/gallops  noted LUNGS: Lungs are clear to auscultation bilaterally, no apparent distress ABDOMEN: soft, nontender, no rebound or guarding GU:no cva tenderness NEURO:Awake/alert, facies symmetric, no arm or leg drift is noted Cranial nerves 3/4/5/6/01/22/09/11/12 tested and intact Gait normal No past pointing No focal sensory deficit to her extremities.  Equal power with hand grip, wrist flex/extension, elbow flex/extension, and equal power with shoulder abduction/adduction.   Equal biceps/brachioradial reflex in bilateral UE EXTREMITIES: pulses normal, full ROM SKIN: warm, color normal, no cyanosis or discoloration to her hands PSYCH: mildly anxious   ED Course  Procedures  2:29 AM Pt well appearing and no neuro deficits For her CP - it is reproducible.  I doubt ACS/Dissection/PE (currently PERC negative) Suspect there may be an anxiety component associated with her presentation For numbness, I doubt acute CVA.  I doubt acute myelopathy   Pt reports now her biggest is concern is pain in right paraspinal area of her neck with "tingling" into right arm and terminates in little finger.  The tingling does not encompass her entire arm and there is no focal sensory deficit on my exam.  She reports this started at home then she had chest wall pain and tingling in feet/lips.  Suspect she may have cervical radiculopathy (denies trauma/fall) and no focal weakness.  We discussed strict return precautions and need for f/u.  Advised her PTN level was low and need for discussion with her physician concerning dosing  MDM  Nursing notes including past medical history and social history reviewed and considered in documentation Labs/vital reviewed and considered xrays reviewed and considered        Date: 10/05/2012  Rate: 69  Rhythm: normal sinus rhythm  QRS Axis: normal  Intervals: normal  ST/T Wave abnormalities: nonspecific ST changes  Conduction Disutrbances:none  Narrative Interpretation:   Old  EKG Reviewed: unchanged and unchanged from 04/28/11    Joya Gaskins, MD 10/05/12 [phone]

## 2012-10-09 ENCOUNTER — Encounter (HOSPITAL_COMMUNITY): Payer: Self-pay | Admitting: *Deleted

## 2012-10-09 ENCOUNTER — Emergency Department (HOSPITAL_COMMUNITY)
Admission: EM | Admit: 2012-10-09 | Discharge: 2012-10-09 | Disposition: A | Discharge status: Home / Self Care | Payer: Self-pay | Attending: Emergency Medicine | Admitting: Emergency Medicine

## 2012-10-09 DIAGNOSIS — R51 Headache: Secondary | ICD-10-CM | POA: Insufficient documentation

## 2012-10-09 DIAGNOSIS — R569 Unspecified convulsions: Secondary | ICD-10-CM

## 2012-10-09 DIAGNOSIS — Z79899 Other long term (current) drug therapy: Secondary | ICD-10-CM | POA: Insufficient documentation

## 2012-10-09 DIAGNOSIS — R339 Retention of urine, unspecified: Secondary | ICD-10-CM | POA: Insufficient documentation

## 2012-10-09 DIAGNOSIS — G40909 Epilepsy, unspecified, not intractable, without status epilepticus: Secondary | ICD-10-CM | POA: Insufficient documentation

## 2012-10-09 DIAGNOSIS — F17200 Nicotine dependence, unspecified, uncomplicated: Secondary | ICD-10-CM | POA: Insufficient documentation

## 2012-10-09 DIAGNOSIS — F411 Generalized anxiety disorder: Secondary | ICD-10-CM | POA: Insufficient documentation

## 2012-10-09 LAB — COMPREHENSIVE METABOLIC PANEL
AST: 23 U/L (ref 0–37)
Albumin: 4.1 g/dL (ref 3.5–5.2)
Alkaline Phosphatase: 75 U/L (ref 39–117)
BUN: 6 mg/dL (ref 6–23)
CO2: 24 mEq/L (ref 19–32)
Chloride: 104 mEq/L (ref 96–112)
Potassium: 4 mEq/L (ref 3.5–5.1)
Total Bilirubin: 0.2 mg/dL — ABNORMAL LOW (ref 0.3–1.2)

## 2012-10-09 LAB — CBC
HCT: 42.4 % (ref 36.0–46.0)
Platelets: 327 10*3/uL (ref 150–400)
RBC: 4.74 MIL/uL (ref 3.87–5.11)
RDW: 13 % (ref 11.5–15.5)
WBC: 7.6 10*3/uL (ref 4.0–10.5)

## 2012-10-09 LAB — GLUCOSE, CAPILLARY: Glucose-Capillary: 95 mg/dL (ref 70–99)

## 2012-10-09 MED ORDER — ONDANSETRON HCL 4 MG/2ML IJ SOLN
INTRAMUSCULAR | Status: AC
  Administered 2012-10-09: 4 mg via INTRAVENOUS
  Filled 2012-10-09: qty 2

## 2012-10-09 MED ORDER — KETOROLAC TROMETHAMINE 30 MG/ML IJ SOLN: 30.0000 mg | Freq: Once | INTRAMUSCULAR | Status: AC

## 2012-10-09 MED ORDER — IBUPROFEN 800 MG PO TABS
800.0000 mg | ORAL_TABLET | Freq: Once | ORAL | Status: AC
  Administered 2012-10-09: 800 mg via ORAL
  Filled 2012-10-09: qty 1

## 2012-10-09 MED ORDER — SODIUM CHLORIDE 0.9 % IV SOLN
1000.0000 mg | Freq: Once | INTRAVENOUS | Status: AC
  Administered 2012-10-09: 1000 mg via INTRAVENOUS
  Filled 2012-10-09: qty 20

## 2012-10-09 MED ORDER — OXYCODONE-ACETAMINOPHEN 5-325 MG PO TABS
1.0000 | ORAL_TABLET | Freq: Once | ORAL | Status: AC
  Administered 2012-10-09: 1 via ORAL
  Filled 2012-10-09: qty 1

## 2012-10-09 MED ORDER — ONDANSETRON HCL 4 MG/2ML IJ SOLN: 4.0000 mg | Freq: Once | INTRAMUSCULAR | Status: AC

## 2012-10-09 MED ORDER — SODIUM CHLORIDE 0.9 % IV BOLUS (SEPSIS)
1000.0000 mL | Freq: Once | INTRAVENOUS | Status: AC
  Administered 2012-10-09: 1000 mL via INTRAVENOUS

## 2012-10-09 MED ORDER — LORAZEPAM 2 MG/ML IJ SOLN
INTRAMUSCULAR | Status: AC
  Administered 2012-10-09: 2 mg via INTRAVENOUS
  Filled 2012-10-09: qty 1

## 2012-10-09 MED ORDER — PHENYTOIN SODIUM 50 MG/ML IJ SOLN
INTRAMUSCULAR | Status: AC
  Filled 2012-10-09: qty 20

## 2012-10-09 MED ORDER — KETOROLAC TROMETHAMINE 30 MG/ML IJ SOLN
INTRAMUSCULAR | Status: AC
  Administered 2012-10-09: 30 mg via INTRAVENOUS
  Filled 2012-10-09: qty 1

## 2012-10-09 NOTE — ED Notes (Signed)
Pt c/o headache and nausea orders obtained for Toradol and Zofran

## 2012-10-09 NOTE — ED Notes (Signed)
Seizure prior to arrival, history of same

## 2012-10-09 NOTE — ED Provider Notes (Signed)
History     This chart was scribed for Donnetta Hutching, MD, MD by Smitty Pluck, ED Scribe. The patient was seen in room APA09/APA09 and the patient's care was started at 5:15 PM.   CSN: [id number]  Arrival date & time 10/09/12  1541      Chief Complaint  Patient presents with  . Seizures   Level V caveat for urgent need for intervention  The history is provided by the patient and a parent. No language interpreter was used.   Whitney Scott is a 27 y.o. female who presents to the Emergency Department complaining of seizure today at 2:00PM.  Pt is post ictal. The seizure was witness by mother. Mother states that the seizure lasted 2 minutes. Pt had urinary incontinence and jerking during the seizure. Pt reports having moderate headache onset after the seizure. Mother denies pt having her head injury, biting tongue, fever, chills, nausea, vomiting, diarrhea, weakness, cough, SOB and any other pain. Pt reports that she takes 200 mg of dilantin 2x/daily.    Neurologist is Dr. Gerilyn Pilgrim   Past Medical History  Diagnosis Date  . Seizures   . Anxiety     Past Surgical History  Procedure Laterality Date  . Cesarean section    . Colon surgery      Family History  Problem Relation Age of Onset  . Seizures Mother   . Diabetes Mother   . Heart failure Mother   . Cancer Father     History  Substance Use Topics  . Smoking status: Current Every Day Smoker -- 0.25 packs/day for 5 years    Types: Cigarettes  . Smokeless tobacco: Never Used  . Alcohol Use: Yes     Comment: couple of times a month    OB History   Grav Para Term Preterm Abortions TAB SAB Ect Mult Living   2 2  2      2       Review of Systems  Unable to perform ROS: Acuity of condition   10 Systems reviewed and all are negative for acute change except as noted in the HPI.   Allergies  Azithromycin; Bactrim; Cephalexin; Keppra; Penicillins; Tramadol; and Vimpat  Home Medications   Current Outpatient Rx  Name   Route  Sig  Dispense  Refill  . acetaminophen (TYLENOL) 500 MG tablet   Oral   Take 500 mg by mouth every 6 (six) hours as needed. For pain         . phenytoin (DILANTIN) 100 MG ER capsule   Oral   Take 200 mg by mouth 2 (two) times daily.            BP 104/62  Pulse 94  Temp(Src) 98.3 F (36.8 C) (Oral)  Ht 5\' 3"  (1.6 m)  Wt 190 lb (86.183 kg)  BMI 33.67 kg/m2  SpO2 97%  LMP 09/29/2012  Physical Exam  Nursing note and vitals reviewed. Constitutional: She is oriented to person, place, and time. She appears well-developed and well-nourished.  Postictal   HENT:  Head: Normocephalic and atraumatic.  Eyes: Conjunctivae and EOM are normal. Pupils are equal, round, and reactive to light.  Neck: Normal range of motion. Neck supple.  Cardiovascular: Normal rate, regular rhythm and normal heart sounds.   Pulmonary/Chest: Effort normal and breath sounds normal.  Abdominal: Soft. Bowel sounds are normal.  Musculoskeletal: Normal range of motion.  Neurological: She is alert and oriented to person, place, and time.     Skin:  Skin is warm and dry.  Psychiatric: She has a normal mood and affect.    ED Course  Procedures (including critical care time) DIAGNOSTIC STUDIES: Oxygen Saturation is 97% on room air, normal by my interpretation.    COORDINATION OF CARE: 5:18 PM Discussed ED treatment with pt and pt agrees.  5:18 PM Ordered:  Medications  ondansetron (ZOFRAN) 4 MG/2ML injection (not administered)  ketorolac (TORADOL) 30 MG/ML injection (not administered)  sodium chloride 0.9 % bolus 1,000 mL (0 mLs Intravenous Stopped 10/09/12 1838)  LORazepam (ATIVAN) 2 MG/ML injection (2 mg Intravenous Given 10/09/12 1852)     Results for orders placed during the hospital encounter of 10/09/12  GLUCOSE, CAPILLARY      Result Value Range   Glucose-Capillary 95  70 - 99 mg/dL  CBC      Result Value Range   WBC 7.6  4.0 - 10.5 K/uL   RBC 4.74  3.87 - 5.11 MIL/uL   Hemoglobin  14.6  12.0 - 15.0 g/dL   HCT 62.1  30.8 - 65.7 %   MCV 89.5  78.0 - 100.0 fL   MCH 30.8  26.0 - 34.0 pg   MCHC 34.4  30.0 - 36.0 g/dL   RDW 84.6  96.2 - 95.2 %   Platelets 327  150 - 400 K/uL  COMPREHENSIVE METABOLIC PANEL      Result Value Range   Sodium 137  135 - 145 mEq/L   Potassium 4.0  3.5 - 5.1 mEq/L   Chloride 104  96 - 112 mEq/L   CO2 24  19 - 32 mEq/L   Glucose, Bld 98  70 - 99 mg/dL   BUN 6  6 - 23 mg/dL   Creatinine, Ser 8.41  0.50 - 1.10 mg/dL   Calcium 9.6  8.4 - 32.4 mg/dL   Total Protein 7.3  6.0 - 8.3 g/dL   Albumin 4.1  3.5 - 5.2 g/dL   AST 23  0 - 37 U/L   ALT 26  0 - 35 U/L   Alkaline Phosphatase 75  39 - 117 U/L   Total Bilirubin 0.2 (*) 0.3 - 1.2 mg/dL   GFR calc non Af Amer >90  >90 mL/min   GFR calc Af Amer >90  >90 mL/min         No results found.   No diagnosis found.   Date: 10/09/2012  Rate: 83  Rhythm: normal sinus rhythm  QRS Axis: normal  Intervals: normal  ST/T Wave abnormalities: normal  Conduction Disutrbances: none  Narrative Interpretation: unremarkable  CRITICAL CARE Performed by: Donnetta Hutching   Total critical care time: 30 Critical care time was exclusive of separately billable procedures and treating other patients.  Critical care was necessary to treat or prevent imminent or life-threatening deterioration.  Critical care was time spent personally by me on the following activities: development of treatment plan with patient and/or surrogate as well as nursing, discussions with consultants, evaluation of patient's response to treatment, examination of patient, obtaining history from patient or surrogate, ordering and performing treatments and interventions, ordering and review of laboratory studies, ordering and review of radiographic studies, pulse oximetry and re-evaluation of patient's condition.   MDM   patient has known seizure disorder. Dilantin level subtherapeutic. Load with Dilantin 1 g IV in emergency  department.  Increase Dilantin to 300 mg twice a day. Followup with neurologist tomorrow      I personally performed the services described in this documentation, which was scribed  in my presence. The recorded information has been reviewed and is accurate.    Donnetta Hutching, MD 10/09/12 2312

## 2012-10-09 NOTE — ED Notes (Signed)
Pt having seizure airway secured and pt orally suctioned. Ativan to be given

## 2013-08-19 ENCOUNTER — Encounter (HOSPITAL_COMMUNITY): Payer: Self-pay | Admitting: Emergency Medicine

## 2013-08-19 ENCOUNTER — Emergency Department (HOSPITAL_COMMUNITY)
Admission: EM | Admit: 2013-08-19 | Discharge: 2013-08-19 | Disposition: A | Discharge status: Home / Self Care | Payer: Medicaid Other | Attending: Emergency Medicine | Admitting: Emergency Medicine

## 2013-08-19 ENCOUNTER — Emergency Department (HOSPITAL_COMMUNITY): Payer: Medicaid Other

## 2013-08-19 DIAGNOSIS — Y9289 Other specified places as the place of occurrence of the external cause: Secondary | ICD-10-CM | POA: Insufficient documentation

## 2013-08-19 DIAGNOSIS — Y9301 Activity, walking, marching and hiking: Secondary | ICD-10-CM | POA: Insufficient documentation

## 2013-08-19 DIAGNOSIS — F17200 Nicotine dependence, unspecified, uncomplicated: Secondary | ICD-10-CM | POA: Insufficient documentation

## 2013-08-19 DIAGNOSIS — Z8781 Personal history of (healed) traumatic fracture: Secondary | ICD-10-CM | POA: Insufficient documentation

## 2013-08-19 DIAGNOSIS — Z880 Allergy status to penicillin: Secondary | ICD-10-CM | POA: Insufficient documentation

## 2013-08-19 DIAGNOSIS — F411 Generalized anxiety disorder: Secondary | ICD-10-CM | POA: Insufficient documentation

## 2013-08-19 DIAGNOSIS — S9000XA Contusion of unspecified ankle, initial encounter: Secondary | ICD-10-CM | POA: Insufficient documentation

## 2013-08-19 DIAGNOSIS — X500XXA Overexertion from strenuous movement or load, initial encounter: Secondary | ICD-10-CM | POA: Insufficient documentation

## 2013-08-19 DIAGNOSIS — G40909 Epilepsy, unspecified, not intractable, without status epilepticus: Secondary | ICD-10-CM | POA: Insufficient documentation

## 2013-08-19 DIAGNOSIS — W172XXA Fall into hole, initial encounter: Secondary | ICD-10-CM | POA: Insufficient documentation

## 2013-08-19 DIAGNOSIS — S93409A Sprain of unspecified ligament of unspecified ankle, initial encounter: Secondary | ICD-10-CM | POA: Insufficient documentation

## 2013-08-19 DIAGNOSIS — Z79899 Other long term (current) drug therapy: Secondary | ICD-10-CM | POA: Insufficient documentation

## 2013-08-19 HISTORY — DX: Epilepsy, unspecified, not intractable, without status epilepticus: G40.909

## 2013-08-19 MED ORDER — KETOROLAC TROMETHAMINE 10 MG PO TABS
10.0000 mg | ORAL_TABLET | Freq: Once | ORAL | Status: AC
  Administered 2013-08-19: 10 mg via ORAL
  Filled 2013-08-19: qty 1

## 2013-08-19 MED ORDER — DICLOFENAC SODIUM 75 MG PO TBEC: 75.0000 mg | DELAYED_RELEASE_TABLET | Freq: Two times a day (BID) | ORAL | Status: DC

## 2013-08-19 MED ORDER — HYDROCODONE-ACETAMINOPHEN 5-325 MG PO TABS: 1.0000 | ORAL_TABLET | ORAL | Status: DC | PRN

## 2013-08-19 MED ORDER — ACETAMINOPHEN 325 MG PO TABS
650.0000 mg | ORAL_TABLET | Freq: Once | ORAL | Status: AC
  Administered 2013-08-19: 650 mg via ORAL
  Filled 2013-08-19: qty 2

## 2013-08-19 NOTE — ED Notes (Signed)
Previous fx to right ankle, states recently re-injured the foot and ankle, states it "keeps going out on me"

## 2013-08-19 NOTE — Discharge Instructions (Signed)
Please apply ice and keep your ankle elevated above your waist. Please use your ankle stirrup splint when up and about. Please use crutches until you can safely apply weight to your right lower extremity. Please call the orthopedic specialist listed above and set up an appointment as sone as possible. Ankle Sprain An ankle sprain is an injury to the strong, fibrous tissues (ligaments) that hold the bones of your ankle joint together.  CAUSES An ankle sprain is usually caused by a fall or by twisting your ankle. Ankle sprains most commonly occur when you step on the outer edge of your foot, and your ankle turns inward. People who participate in sports are more prone to these types of injuries.  SYMPTOMS   Pain in your ankle. The pain may be present at rest or only when you are trying to stand or walk.  Swelling.  Bruising. Bruising may develop immediately or within 1 to 2 days after your injury.  Difficulty standing or walking, particularly when turning corners or changing directions. DIAGNOSIS  Your caregiver will ask you details about your injury and perform a physical exam of your ankle to determine if you have an ankle sprain. During the physical exam, your caregiver will press on and apply pressure to specific areas of your foot and ankle. Your caregiver will try to move your ankle in certain ways. An X-ray exam may be done to be sure a bone was not broken or a ligament did not separate from one of the bones in your ankle (avulsion fracture).  TREATMENT  Certain types of braces can help stabilize your ankle. Your caregiver can make a recommendation for this. Your caregiver may recommend the use of medicine for pain. If your sprain is severe, your caregiver may refer you to a surgeon who helps to restore function to parts of your skeletal system (orthopedist) or a physical therapist. HOME CARE INSTRUCTIONS   Apply ice to your injury for 1 2 days or as directed by your caregiver. Applying ice  helps to reduce inflammation and pain.  Put ice in a plastic bag.  Place a towel between your skin and the bag.  Leave the ice on for 15-20 minutes at a time, every 2 hours while you are awake.  Only take over-the-counter or prescription medicines for pain, discomfort, or fever as directed by your caregiver.  Elevate your injured ankle above the level of your heart as much as possible for 2 3 days.  If your caregiver recommends crutches, use them as instructed. Gradually put weight on the affected ankle. Continue to use crutches or a cane until you can walk without feeling pain in your ankle.  If you have a plaster splint, wear the splint as directed by your caregiver. Do not rest it on anything harder than a pillow for the first 24 hours. Do not put weight on it. Do not get it wet. You may take it off to take a shower or bath.  You may have been given an elastic bandage to wear around your ankle to provide support. If the elastic bandage is too tight (you have numbness or tingling in your foot or your foot becomes cold and blue), adjust the bandage to make it comfortable.  If you have an air splint, you may blow more air into it or let air out to make it more comfortable. You may take your splint off at night and before taking a shower or bath. Wiggle your toes in the splint  several times per day to decrease swelling. SEEK MEDICAL CARE IF:   You have rapidly increasing bruising or swelling.  Your toes feel extremely cold or you lose feeling in your foot.  Your pain is not relieved with medicine. SEEK IMMEDIATE MEDICAL CARE IF:  Your toes are numb or blue.  You have severe pain that is increasing. MAKE SURE YOU:   Understand these instructions.  Will watch your condition.  Will get help right away if you are not doing well or get worse. Document Released: 07/03/2005 Document Revised: 03/27/2012 Document Reviewed: 07/15/2011 [facility] Patient Information 2014 [place], [place].

## 2013-08-19 NOTE — ED Notes (Signed)
Fell on Saturday, and again today, Pain rt ankle , esp at lat malleolus.  Says this ankle and foot are "weak" due to previous injury.

## 2013-08-19 NOTE — ED Provider Notes (Signed)
CSN: [id number]     Arrival date & time 08/19/13  1759 History   First MD Initiated Contact with Patient 08/19/13 1939     Chief Complaint  Patient presents with  . Foot Pain  . Ankle Pain   (Consider location/radiation/quality/duration/timing/severity/associated sxs/prior Treatment) HPI Comments: Patient is a 28 year old female who presents to the emergency department with complaint of right ankle pain. The patient states that on Saturday, January 31 she was walking down a decline and did not notice a hole in the yard she twisted the right ankle and fell. She had pain after that but then this morning she stood and her ankle" gave way" he fell again. She complains now of increasing pain of the right foot and ankle. It is of note that she has had 2 fractures involving this ankle in the past, and 3 years ago she was told that it was not healing correctly. She's had some weakness involving this ankle since that time. She has not had orthopedic evaluation concerning this over 3 years. It is of note the patient is not on any blood thinning type medications. She has had fractures in this area but has not had operations involving this particular ankle or foot.  Patient is a 28 y.o. female presenting with lower extremity pain and ankle pain. The history is provided by the patient.  Foot Pain The current episode started in the past 7 days. Associated symptoms include arthralgias. Pertinent negatives include no abdominal pain, chest pain, coughing or neck pain.  Ankle Pain Associated symptoms: no back pain and no neck pain     Past Medical History  Diagnosis Date  . Seizures   . Anxiety   . Epilepsy    Past Surgical History  Procedure Laterality Date  . Cesarean section    . Colon surgery     Family History  Problem Relation Age of Onset  . Seizures Mother   . Diabetes Mother   . Heart failure Mother   . Cancer Father    History  Substance Use Topics  . Smoking status: Current Every Day  Smoker -- 0.25 packs/day for 5 years    Types: Cigarettes  . Smokeless tobacco: Never Used  . Alcohol Use: Yes     Comment: couple of times a month   OB History   Grav Para Term Preterm Abortions TAB SAB Ect Mult Living   2 2  2      2      Review of Systems  Constitutional: Negative for activity change.       All ROS Neg except as noted in HPI  HENT: Negative for nosebleeds.   Eyes: Negative for photophobia and discharge.  Respiratory: Negative for cough, shortness of breath and wheezing.   Cardiovascular: Negative for chest pain and palpitations.  Gastrointestinal: Negative for abdominal pain and blood in stool.  Genitourinary: Negative for dysuria, frequency and hematuria.  Musculoskeletal: Positive for arthralgias. Negative for back pain and neck pain.  Skin: Negative.   Neurological: Positive for seizures. Negative for dizziness and speech difficulty.  Psychiatric/Behavioral: Negative for hallucinations and confusion. The patient is nervous/anxious.     Allergies  Azithromycin; Bactrim; Cephalexin; Keppra; Penicillins; Tramadol; and Vimpat  Home Medications   Current Outpatient Rx  Name  Route  Sig  Dispense  Refill  . phenytoin (DILANTIN) 100 MG ER capsule   Oral   Take 200 mg by mouth 2 (two) times daily.          Marland Kitchen  acetaminophen (TYLENOL) 500 MG tablet   Oral   Take 500 mg by mouth every 6 (six) hours as needed. For pain          BP 130/76  Pulse 89  Temp(Src) 98.5 F (36.9 C) (Oral)  Resp 16  Ht 5\' 3"  (1.6 m)  Wt 212 lb (96.163 kg)  BMI 37.56 kg/m2  SpO2 98%  LMP 07/31/2013 Physical Exam  Nursing note and vitals reviewed. Constitutional: She is oriented to person, place, and time. She appears well-developed and well-nourished.  Non-toxic appearance.  HENT:  Head: Normocephalic.  Right Ear: Tympanic membrane and external ear normal.  Left Ear: Tympanic membrane and external ear normal.  Eyes: EOM and lids are normal. Pupils are equal, round, and  reactive to light.  Neck: Normal range of motion. Neck supple. Carotid bruit is not present.  Cardiovascular: Normal rate, regular rhythm, normal heart sounds, intact distal pulses and normal pulses.   Pulmonary/Chest: Breath sounds normal. No respiratory distress.  Abdominal: Soft. Bowel sounds are normal. There is no tenderness. There is no guarding.  Musculoskeletal: Normal range of motion.  There is full range of motion of the right hip and knee. There is no deformity of the anterior tibial area. There is no palpable deformity or hematoma involving the fibula on the right. There is pain with attempted range of motion of the right ankle. There is some pain and bruising of the lateral malleolus. There is also some swelling involving the dorsum of the right foot. The dorsalis pedis and posterior tibial pulses are 2+ bilaterally. There no lesions noted between the toes. There no puncture wounds of the plantar surface of the right foot. The capillary refill is less than 2 seconds bilaterally.  Lymphadenopathy:       Head (right side): No submandibular adenopathy present.       Head (left side): No submandibular adenopathy present.    She has no cervical adenopathy.  Neurological: She is alert and oriented to person, place, and time. She has normal strength. No cranial nerve deficit or sensory deficit.  Skin: Skin is warm and dry.  Psychiatric: She has a normal mood and affect. Her speech is normal.    ED Course  Procedures (including critical care time) Labs Review Labs Reviewed - No data to display Imaging Review Dg Ankle Complete Right  08/19/2013   CLINICAL DATA:  Right ankle pain and bruising secondary to two recent falls.  EXAM: RIGHT ANKLE - COMPLETE 3+ VIEW  COMPARISON:  01/01/2011  FINDINGS: There is no evidence of fracture, dislocation, or joint effusion. There is no evidence of arthropathy or other focal bone abnormality. Soft tissues are unremarkable.  IMPRESSION: Normal exam.    Electronically Signed   By: Geanie CooleyJim  Maxwell M.D.   On: 08/19/2013 19:40    EKG Interpretation   None       MDM Patient sustained 2 fractures of the right ankle in the past. She was told approximately 3-4 years ago that one of the fractures did not heal correctly. She's been having some problems with weakness of her right ankle since that time. She recently sustained a fall. And then had a repeat fall earlier this morning.   No diagnosis found. *I have reviewed nursing notes, vital signs, and all appropriate lab and imaging results for this patient.** X-ray of the right ankle is negative for fracture or dislocation.  The plan at this time is for the patient be placed in an ankle stirrup splint.  She will be issued crutches, as well as an ice pack. The patient will be treated with diclofenac and Norco for pain. Patient is referred to orthopedics for additional evaluation and management. Patient advised to use the ankle stirrup splint and crutches until seen by orthopedics.   Kathie Dike, PA-C 08/19/13 2005

## 2013-08-19 NOTE — ED Provider Notes (Signed)
Medical screening examination/treatment/procedure(s) were performed by non-physician practitioner and as supervising physician I was immediately available for consultation/collaboration.  EKG Interpretation   None         Gilda Creasehristopher J. Amayra Kiedrowski, MD 08/19/13 2009

## 2013-09-25 ENCOUNTER — Encounter (HOSPITAL_COMMUNITY): Payer: Self-pay | Admitting: Emergency Medicine

## 2013-09-25 ENCOUNTER — Emergency Department (HOSPITAL_COMMUNITY)
Admission: EM | Admit: 2013-09-25 | Discharge: 2013-09-26 | Disposition: A | Discharge status: Home / Self Care | Payer: Medicaid Other | Attending: Emergency Medicine | Admitting: Emergency Medicine

## 2013-09-25 DIAGNOSIS — Z880 Allergy status to penicillin: Secondary | ICD-10-CM | POA: Insufficient documentation

## 2013-09-25 DIAGNOSIS — F17200 Nicotine dependence, unspecified, uncomplicated: Secondary | ICD-10-CM | POA: Insufficient documentation

## 2013-09-25 DIAGNOSIS — R5383 Other fatigue: Secondary | ICD-10-CM

## 2013-09-25 DIAGNOSIS — R5381 Other malaise: Secondary | ICD-10-CM | POA: Insufficient documentation

## 2013-09-25 DIAGNOSIS — N949 Unspecified condition associated with female genital organs and menstrual cycle: Secondary | ICD-10-CM | POA: Insufficient documentation

## 2013-09-25 DIAGNOSIS — Z3202 Encounter for pregnancy test, result negative: Secondary | ICD-10-CM | POA: Insufficient documentation

## 2013-09-25 DIAGNOSIS — Z79899 Other long term (current) drug therapy: Secondary | ICD-10-CM | POA: Insufficient documentation

## 2013-09-25 DIAGNOSIS — G40909 Epilepsy, unspecified, not intractable, without status epilepticus: Secondary | ICD-10-CM | POA: Insufficient documentation

## 2013-09-25 DIAGNOSIS — F411 Generalized anxiety disorder: Secondary | ICD-10-CM | POA: Insufficient documentation

## 2013-09-25 DIAGNOSIS — N920 Excessive and frequent menstruation with regular cycle: Secondary | ICD-10-CM | POA: Insufficient documentation

## 2013-09-25 DIAGNOSIS — N921 Excessive and frequent menstruation with irregular cycle: Secondary | ICD-10-CM

## 2013-09-25 LAB — CBC WITH DIFFERENTIAL/PLATELET
BASOS PCT: 0 % (ref 0–1)
Basophils Absolute: 0 10*3/uL (ref 0.0–0.1)
Eosinophils Absolute: 0.1 10*3/uL (ref 0.0–0.7)
Eosinophils Relative: 2 % (ref 0–5)
HEMATOCRIT: 42 % (ref 36.0–46.0)
Hemoglobin: 14.5 g/dL (ref 12.0–15.0)
Lymphocytes Relative: 36 % (ref 12–46)
Lymphs Abs: 2.6 10*3/uL (ref 0.7–4.0)
MCH: 31.2 pg (ref 26.0–34.0)
MCHC: 34.5 g/dL (ref 30.0–36.0)
MCV: 90.3 fL (ref 78.0–100.0)
MONO ABS: 0.5 10*3/uL (ref 0.1–1.0)
Monocytes Relative: 7 % (ref 3–12)
Neutro Abs: 4.1 10*3/uL (ref 1.7–7.7)
Neutrophils Relative %: 55 % (ref 43–77)
Platelets: 309 10*3/uL (ref 150–400)
RBC: 4.65 MIL/uL (ref 3.87–5.11)
RDW: 13.5 % (ref 11.5–15.5)
WBC: 7.3 10*3/uL (ref 4.0–10.5)

## 2013-09-25 LAB — BASIC METABOLIC PANEL
BUN: 6 mg/dL (ref 6–23)
CO2: 27 mEq/L (ref 19–32)
CREATININE: 0.72 mg/dL (ref 0.50–1.10)
Calcium: 9 mg/dL (ref 8.4–10.5)
Chloride: 103 mEq/L (ref 96–112)
GFR calc non Af Amer: >90 mL/min (ref >90)
Glucose, Bld: 91 mg/dL (ref 70–99)
Potassium: 3.9 mEq/L (ref 3.7–5.3)
Sodium: 140 mEq/L (ref 137–147)

## 2013-09-25 LAB — PREGNANCY, URINE: Preg Test, Ur: NEGATIVE

## 2013-09-25 MED ORDER — KETOROLAC TROMETHAMINE 60 MG/2ML IM SOLN
60.0000 mg | Freq: Once | INTRAMUSCULAR | Status: AC
  Administered 2013-09-26: 60 mg via INTRAMUSCULAR
  Filled 2013-09-25: qty 2

## 2013-09-25 NOTE — ED Notes (Signed)
Patient reports heavy bleeding and abnormal periods for several months. Reports has been bleeding since the end of February with abdominal cramping.

## 2013-09-26 MED ORDER — NAPROXEN 500 MG PO TABS: 500.0000 mg | ORAL_TABLET | Freq: Two times a day (BID) | ORAL | Status: DC

## 2013-09-26 MED ORDER — MEGESTROL ACETATE 40 MG PO TABS: 40.0000 mg | ORAL_TABLET | Freq: Two times a day (BID) | ORAL | Status: DC

## 2013-09-26 NOTE — Discharge Instructions (Signed)

## 2013-09-27 NOTE — ED Provider Notes (Signed)
CSN: [id number]     Arrival date & time 09/25/13  2040 History   First MD Initiated Contact with Patient 09/25/13 2229     Chief Complaint  Patient presents with  . Abdominal Cramping  . Vaginal Bleeding     (Consider location/radiation/quality/duration/timing/severity/associated sxs/prior Treatment) HPI Comments: Whitney Scott is a 28 y.o. Female with a known history of endometriosis as diagnosed by her gynecologist, presenting with heavy menstrual bleeding and menstrual cramping.  She was diagnosed with early endometriosis 1 year ago, and for the past several months has had increasing heavy periods and more frequent and longer cycles.  She states she finished a 10 day period mid February which was heavy,  And now has been bleeding again for the past 2 weeks.  She feels fatigued and describes having to change her pad every 2 hours on average for the past week.  She denies dizziness, fevers or chills and has had no vaginal discharge.  She has not been able to be seen by her gynecologist [facility]  She has found no alleviators for her symptoms.     The history is provided by the patient and a parent.    Past Medical History  Diagnosis Date  . Seizures   . Anxiety   . Epilepsy    Past Surgical History  Procedure Laterality Date  . Cesarean section    . Colon surgery     Family History  Problem Relation Age of Onset  . Seizures Mother   . Diabetes Mother   . Heart failure Mother   . Cancer Father    History  Substance Use Topics  . Smoking status: Current Every Day Smoker -- 0.25 packs/day for 5 years    Types: Cigarettes  . Smokeless tobacco: Never Used  . Alcohol Use: Yes     Comment: couple of times a month   OB History   Grav Para Term Preterm Abortions TAB SAB Ect Mult Living   2 2  2      2      Review of Systems  Constitutional: Positive for fatigue. Negative for fever.  HENT: Negative for congestion and sore throat.   Eyes: Negative.   Respiratory:  Negative for chest tightness and shortness of breath.   Cardiovascular: Negative for chest pain.  Gastrointestinal: Negative for nausea and abdominal pain.  Genitourinary: Positive for menstrual problem and pelvic pain. Negative for vaginal discharge.  Musculoskeletal: Negative for arthralgias, joint swelling and neck pain.  Skin: Negative.  Negative for rash and wound.  Neurological: Negative for dizziness, weakness, light-headedness, numbness and headaches.  Psychiatric/Behavioral: Negative.       Allergies  Azithromycin; Bactrim; Cephalexin; Keppra; Penicillins; Tramadol; and Vimpat  Home Medications   Current Outpatient Rx  Name  Route  Sig  Dispense  Refill  . phenytoin (DILANTIN) 100 MG ER capsule   Oral   Take 200 mg by mouth 2 (two) times daily.          . megestrol (MEGACE) 40 MG tablet   Oral   Take 1 tablet (40 mg total) by mouth 2 (two) times daily.   10 tablet   0   . naproxen (NAPROSYN) 500 MG tablet   Oral   Take 1 tablet (500 mg total) by mouth 2 (two) times daily.   30 tablet   0    BP 114/72  Pulse 73  Temp(Src) 98 F (36.7 C) (Oral)  Resp 18  Ht 5\' 3"  (1.6 m)  Wt 212 lb (96.163 kg)  BMI 37.56 kg/m2  SpO2 95%  LMP 09/13/2013 Physical Exam  Nursing note and vitals reviewed. Constitutional: She appears well-developed and well-nourished.  HENT:  Head: Normocephalic and atraumatic.  Eyes: Conjunctivae are normal.  Neck: Normal range of motion.  Cardiovascular: Normal rate, regular rhythm, normal heart sounds and intact distal pulses.   Pulmonary/Chest: Effort normal and breath sounds normal. She has no wheezes.  Abdominal: Soft. Bowel sounds are normal. There is no tenderness.  Genitourinary: Vagina normal. Uterus is not enlarged and not tender. Cervix exhibits no motion tenderness. Right adnexum displays no tenderness. Left adnexum displays no tenderness.  Cervical os closed.  Moderate amount of blood in vagina, no clots.  Musculoskeletal:  Normal range of motion.  Neurological: She is alert.  Skin: Skin is warm and dry.  Psychiatric: She has a normal mood and affect.    ED Course  Procedures (including critical care time) Labs Review Labs Reviewed  CBC WITH DIFFERENTIAL  BASIC METABOLIC PANEL  PREGNANCY, URINE   Imaging Review No results found.   EKG Interpretation None      MDM   Final diagnoses:  Menorrhagia with irregular cycle    Patients labs and/or radiological studies were viewed and considered during the medical decision making and disposition process. Pts rbc count is normal, no anemia from blood loss.  She was given toradol injection which helped with cramping.  She was prescribed naproxen for cramping.  Prescribed megace to help stop bleeding cycle.  Encouraged to contact [facility] for recheck of her symptoms within 1 week.  Pt understands and agrees with plan.    Burgess Amor, PA-C 09/28/13 0000

## 2013-09-29 NOTE — ED Provider Notes (Signed)
Medical screening examination/treatment/procedure(s) were performed by non-physician practitioner and as supervising physician I was immediately available for consultation/collaboration.   EKG Interpretation None        Shon Batonourtney F Quanda Pavlicek, MD 09/29/13 [phone]

## 2013-10-08 ENCOUNTER — Ambulatory Visit: Payer: Self-pay | Admitting: Family Medicine

## 2014-02-24 ENCOUNTER — Encounter (HOSPITAL_COMMUNITY): Payer: Self-pay | Admitting: Emergency Medicine

## 2014-02-24 ENCOUNTER — Emergency Department (HOSPITAL_COMMUNITY): Payer: Medicaid Other

## 2014-02-24 ENCOUNTER — Emergency Department (HOSPITAL_COMMUNITY)
Admission: EM | Admit: 2014-02-24 | Discharge: 2014-02-24 | Disposition: A | Discharge status: Home / Self Care | Payer: Medicaid Other | Attending: Emergency Medicine | Admitting: Emergency Medicine

## 2014-02-24 DIAGNOSIS — W1809XA Striking against other object with subsequent fall, initial encounter: Secondary | ICD-10-CM | POA: Diagnosis not present

## 2014-02-24 DIAGNOSIS — F411 Generalized anxiety disorder: Secondary | ICD-10-CM | POA: Insufficient documentation

## 2014-02-24 DIAGNOSIS — R569 Unspecified convulsions: Secondary | ICD-10-CM | POA: Insufficient documentation

## 2014-02-24 DIAGNOSIS — Z79899 Other long term (current) drug therapy: Secondary | ICD-10-CM | POA: Insufficient documentation

## 2014-02-24 DIAGNOSIS — G40909 Epilepsy, unspecified, not intractable, without status epilepticus: Secondary | ICD-10-CM | POA: Diagnosis not present

## 2014-02-24 DIAGNOSIS — F17200 Nicotine dependence, unspecified, uncomplicated: Secondary | ICD-10-CM | POA: Diagnosis not present

## 2014-02-24 DIAGNOSIS — Y9389 Activity, other specified: Secondary | ICD-10-CM | POA: Insufficient documentation

## 2014-02-24 DIAGNOSIS — IMO0002 Reserved for concepts with insufficient information to code with codable children: Secondary | ICD-10-CM | POA: Diagnosis not present

## 2014-02-24 DIAGNOSIS — Z880 Allergy status to penicillin: Secondary | ICD-10-CM | POA: Insufficient documentation

## 2014-02-24 DIAGNOSIS — Y929 Unspecified place or not applicable: Secondary | ICD-10-CM | POA: Insufficient documentation

## 2014-02-24 LAB — URINE MICROSCOPIC-ADD ON

## 2014-02-24 LAB — BASIC METABOLIC PANEL
Anion gap: 14 (ref 5–15)
BUN: 8 mg/dL (ref 6–23)
CALCIUM: 9.1 mg/dL (ref 8.4–10.5)
CO2: 21 mEq/L (ref 19–32)
CREATININE: 0.78 mg/dL (ref 0.50–1.10)
Chloride: 102 mEq/L (ref 96–112)
Glucose, Bld: 142 mg/dL — ABNORMAL HIGH (ref 70–99)
Potassium: 3.7 mEq/L (ref 3.7–5.3)
Sodium: 137 mEq/L (ref 137–147)

## 2014-02-24 LAB — URINALYSIS, ROUTINE W REFLEX MICROSCOPIC
Bilirubin Urine: NEGATIVE
GLUCOSE, UA: NEGATIVE mg/dL
Leukocytes, UA: NEGATIVE
Nitrite: NEGATIVE
PROTEIN: NEGATIVE mg/dL
Specific Gravity, Urine: >1.03 — ABNORMAL HIGH (ref 1.005–1.030)
Urobilinogen, UA: 0.2 mg/dL (ref 0.0–1.0)
pH: 5.5 (ref 5.0–8.0)

## 2014-02-24 LAB — PHENYTOIN LEVEL, TOTAL: Phenytoin Lvl: <2.5 ug/mL — ABNORMAL LOW (ref 10.0–20.0)

## 2014-02-24 LAB — CBC WITH DIFFERENTIAL/PLATELET
Basophils Absolute: 0 10*3/uL (ref 0.0–0.1)
Basophils Relative: 0 % (ref 0–1)
EOS ABS: 0.1 10*3/uL (ref 0.0–0.7)
EOS PCT: 1 % (ref 0–5)
HCT: 38 % (ref 36.0–46.0)
HEMOGLOBIN: 13.3 g/dL (ref 12.0–15.0)
Lymphocytes Relative: 35 % (ref 12–46)
Lymphs Abs: 3.1 10*3/uL (ref 0.7–4.0)
MCH: 30.5 pg (ref 26.0–34.0)
MCHC: 35 g/dL (ref 30.0–36.0)
MCV: 87.2 fL (ref 78.0–100.0)
MONO ABS: 0.5 10*3/uL (ref 0.1–1.0)
MONOS PCT: 5 % (ref 3–12)
Neutro Abs: 5.1 10*3/uL (ref 1.7–7.7)
Neutrophils Relative %: 59 % (ref 43–77)
Platelets: 316 10*3/uL (ref 150–400)
RBC: 4.36 MIL/uL (ref 3.87–5.11)
RDW: 12.4 % (ref 11.5–15.5)
WBC: 8.9 10*3/uL (ref 4.0–10.5)

## 2014-02-24 LAB — PREGNANCY, URINE: Preg Test, Ur: NEGATIVE

## 2014-02-24 MED ORDER — PHENYTOIN SODIUM 50 MG/ML IJ SOLN
1000.0000 mg | Freq: Once | INTRAMUSCULAR | Status: AC
  Administered 2014-02-24: 1000 mg via INTRAVENOUS
  Filled 2014-02-24: qty 20

## 2014-02-24 MED ORDER — HYDROMORPHONE HCL PF 1 MG/ML IJ SOLN
0.5000 mg | Freq: Once | INTRAMUSCULAR | Status: AC
  Administered 2014-02-24: 0.5 mg via INTRAVENOUS
  Filled 2014-02-24: qty 1

## 2014-02-24 MED ORDER — ONDANSETRON HCL 4 MG/2ML IJ SOLN
4.0000 mg | Freq: Once | INTRAMUSCULAR | Status: AC
  Administered 2014-02-24: 4 mg via INTRAVENOUS
  Filled 2014-02-24: qty 2

## 2014-02-24 MED ORDER — HYDROCODONE-ACETAMINOPHEN 5-325 MG PO TABS: 1.0000 | ORAL_TABLET | Freq: Four times a day (QID) | ORAL | Status: DC | PRN

## 2014-02-24 NOTE — ED Provider Notes (Signed)
CSN: [id number]     Arrival date & time 02/24/14  1008 History   First MD Initiated Contact with Patient 02/24/14 1237     Chief Complaint  Patient presents with  . Seizures     (Consider location/radiation/quality/duration/timing/severity/associated sxs/prior Treatment) Patient is a 28 y.o. female presenting with seizures. The history is provided by the patient (pt has not been taking her medicine and had a sz today).  Seizures Seizure activity on arrival: no   Seizure type:  Focal Preceding symptoms: no sensation of an aura present   Initial focality:  Diffuse Episode characteristics: no abnormal movements   Postictal symptoms: no confusion   Return to baseline: yes     Past Medical History  Diagnosis Date  . Seizures   . Anxiety   . Epilepsy    Past Surgical History  Procedure Laterality Date  . Cesarean section    . Colon surgery     Family History  Problem Relation Age of Onset  . Seizures Mother   . Diabetes Mother   . Heart failure Mother   . Cancer Father    History  Substance Use Topics  . Smoking status: Current Every Day Smoker -- 0.25 packs/day for 5 years    Types: Cigarettes  . Smokeless tobacco: Never Used  . Alcohol Use: Yes     Comment: occ   OB History   Grav Para Term Preterm Abortions TAB SAB Ect Mult Living   2 2  2      2      Review of Systems  Constitutional: Negative for appetite change and fatigue.  HENT: Negative for congestion, ear discharge and sinus pressure.   Eyes: Negative for discharge.  Respiratory: Negative for cough.   Cardiovascular: Negative for chest pain.  Gastrointestinal: Negative for abdominal pain and diarrhea.  Genitourinary: Negative for frequency and hematuria.  Musculoskeletal: Negative for back pain.  Skin: Negative for rash.  Neurological: Positive for seizures. Negative for headaches.  Psychiatric/Behavioral: Negative for hallucinations.      Allergies  Azithromycin; Bactrim; Cephalexin; Keppra;  Penicillins; Ultram; and Vimpat  Home Medications   Prior to Admission medications   Medication Sig Start Date End Date Taking? Authorizing Provider  HYDROcodone-acetaminophen (NORCO/VICODIN) 5-325 MG per tablet Take 1 tablet by mouth every 6 (six) hours as needed. 02/24/14   Benny LennertJoseph L Saidy Ormand, MD  phenytoin (DILANTIN) 100 MG ER capsule Take 200 mg by mouth 2 (two) times daily.     Historical Provider, MD   BP 106/66  Pulse 95  Temp(Src) 98 F (36.7 C)  Resp 15  Ht 5\' 3"  (1.6 m)  Wt 215 lb (97.523 kg)  BMI 38.09 kg/m2  SpO2 97%  LMP 01/28/2014 Physical Exam  Constitutional: She is oriented to person, place, and time. She appears well-developed.  HENT:  Head: Normocephalic.  Abrasion to forehead  Eyes: Conjunctivae and EOM are normal. No scleral icterus.  Neck: Neck supple. No thyromegaly present.  Cardiovascular: Normal rate and regular rhythm.  Exam reveals no gallop and no friction rub.   No murmur heard. Pulmonary/Chest: No stridor. She has no wheezes. She has no rales. She exhibits no tenderness.  Abdominal: She exhibits no distension. There is no tenderness. There is no rebound.  Musculoskeletal: Normal range of motion. She exhibits no edema.  Lymphadenopathy:    She has no cervical adenopathy.  Neurological: She is oriented to person, place, and time. She exhibits normal muscle tone. Coordination normal.  Skin: No rash noted. No  erythema.  Psychiatric: She has a normal mood and affect. Her behavior is normal.    ED Course  Procedures (including critical care time) Labs Review Labs Reviewed  BASIC METABOLIC PANEL - Abnormal; Notable for the following:    Glucose, Bld 142 (*)    All other components within normal limits  PHENYTOIN LEVEL, TOTAL - Abnormal; Notable for the following:    Phenytoin Lvl <2.5 (*)    All other components within normal limits  URINALYSIS, ROUTINE W REFLEX MICROSCOPIC - Abnormal; Notable for the following:    Specific Gravity, Urine >1.030  (*)    Hgb urine dipstick SMALL (*)    Ketones, ur TRACE (*)    All other components within normal limits  URINE MICROSCOPIC-ADD ON - Abnormal; Notable for the following:    Squamous Epithelial / LPF FEW (*)    Bacteria, UA FEW (*)    All other components within normal limits  CBC WITH DIFFERENTIAL  PREGNANCY, URINE    Imaging Review Ct Head Wo Contrast  02/24/2014   CLINICAL DATA:  Seizure today. Headache. Patient fell and struck his head during the seizure. Abrasion to the nose and scalp hematoma over the forehead.  EXAM: CT HEAD WITHOUT CONTRAST  CT CERVICAL SPINE WITHOUT CONTRAST  TECHNIQUE: Multidetector CT imaging of the head and cervical spine was performed following the standard protocol without intravenous contrast. Multiplanar CT image reconstructions of the cervical spine were also generated.  COMPARISON:  CT scans dated 12/01/2010 and 06/26/2010  FINDINGS: CT HEAD FINDINGS  No mass lesion. No midline shift. No acute hemorrhage or hematoma. No extra-axial fluid collections. No evidence of acute infarction. Brain parenchyma is normal. 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 No acute osseous abnormality. There is a scalp hematoma over the left side of the frontal bone.  CT CERVICAL SPINE FINDINGS  There is no fracture, subluxation, disc space narrowing, facet arthritis, or prevertebral soft tissue swelling.  IMPRESSION: Normal cervical spine.  Scalp hematoma.  No acute intracranial abnormality.   Electronically Signed   By: Geanie Cooley M.D.   On: 02/24/2014 13:37   Ct Cervical Spine Wo Contrast  02/24/2014   CLINICAL DATA:  Seizure today. Headache. Patient fell and struck his head during the seizure. Abrasion to the nose and scalp hematoma over the forehead.  EXAM: CT HEAD WITHOUT CONTRAST  CT CERVICAL SPINE WITHOUT CONTRAST  TECHNIQUE: Multidetector CT imaging of the head and cervical spine was performed following the standard protocol without intravenous  contrast. Multiplanar CT image reconstructions of the cervical spine were also generated.  COMPARISON:  CT scans dated 12/01/2010 and 06/26/2010  FINDINGS: CT HEAD FINDINGS  No mass lesion. No midline shift. No acute hemorrhage or hematoma. No extra-axial fluid collections. No evidence of acute infarction. Brain parenchyma is normal. 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 No acute osseous abnormality. There is a scalp hematoma over the left side of the frontal bone.  CT CERVICAL SPINE FINDINGS  There is no fracture, subluxation, disc space narrowing, facet arthritis, or prevertebral soft tissue swelling.  IMPRESSION: Normal cervical spine.  Scalp hematoma.  No acute intracranial abnormality.   Electronically Signed   By: Geanie Cooley M.D.   On: 02/24/2014 13:37     EKG Interpretation None      MDM   Final diagnoses:  Seizure    sz   The chart was scribed for me under my direct supervision.  I personally performed the history, physical, and  medical decision making and all procedures in the evaluation of this patient.Benny Lennert, MD 02/24/14 [id number]

## 2014-02-24 NOTE — ED Notes (Signed)
In room. Bedside monitor. VSS. NAD. Pt reports she has been taking Dilantin for 2 years and stopped on Sunday because it was making preexisting headaches worse. Reports waking up feeling "funny" and nausea. Attempted to walk outside, remembers making it to the door and nothing beyond that.

## 2014-02-24 NOTE — ED Notes (Signed)
ems reports cbg 117.

## 2014-02-24 NOTE — ED Notes (Signed)
EMS reports pt has history of seizures and is on dilantin.  Reports has not taken it in 2 days.  Pt had seizure today and fell outside.  Pt has abrasions to nose and forehead.  Pt says doesn't remember being outside.  Pt says has had a bad headache for the past couple of days and says the dilantin makes it worse.

## 2014-02-24 NOTE — Discharge Instructions (Signed)
Take your dilantin and follow up with your md in one week.

## 2014-03-03 ENCOUNTER — Encounter (HOSPITAL_COMMUNITY): Payer: Self-pay | Admitting: Emergency Medicine

## 2014-03-03 ENCOUNTER — Telehealth: Payer: Self-pay | Admitting: Family Medicine

## 2014-03-03 ENCOUNTER — Emergency Department (HOSPITAL_COMMUNITY): Payer: Medicaid Other

## 2014-03-03 ENCOUNTER — Emergency Department (HOSPITAL_COMMUNITY)
Admission: EM | Admit: 2014-03-03 | Discharge: 2014-03-03 | Disposition: A | Discharge status: Home / Self Care | Payer: Medicaid Other | Attending: Emergency Medicine | Admitting: Emergency Medicine

## 2014-03-03 DIAGNOSIS — R51 Headache: Secondary | ICD-10-CM | POA: Insufficient documentation

## 2014-03-03 DIAGNOSIS — F411 Generalized anxiety disorder: Secondary | ICD-10-CM | POA: Insufficient documentation

## 2014-03-03 DIAGNOSIS — Z79899 Other long term (current) drug therapy: Secondary | ICD-10-CM | POA: Diagnosis not present

## 2014-03-03 DIAGNOSIS — Z880 Allergy status to penicillin: Secondary | ICD-10-CM | POA: Insufficient documentation

## 2014-03-03 DIAGNOSIS — R519 Headache, unspecified: Secondary | ICD-10-CM

## 2014-03-03 DIAGNOSIS — G44309 Post-traumatic headache, unspecified, not intractable: Secondary | ICD-10-CM | POA: Diagnosis not present

## 2014-03-03 DIAGNOSIS — F17200 Nicotine dependence, unspecified, uncomplicated: Secondary | ICD-10-CM | POA: Insufficient documentation

## 2014-03-03 DIAGNOSIS — G40909 Epilepsy, unspecified, not intractable, without status epilepticus: Secondary | ICD-10-CM | POA: Insufficient documentation

## 2014-03-03 MED ORDER — OXYCODONE-ACETAMINOPHEN 5-325 MG PO TABS: 1.0000 | ORAL_TABLET | Freq: Four times a day (QID) | ORAL | Status: DC | PRN

## 2014-03-03 MED ORDER — LORAZEPAM 2 MG/ML IJ SOLN
1.0000 mg | Freq: Once | INTRAMUSCULAR | Status: AC
  Administered 2014-03-03: 1 mg via INTRAVENOUS
  Filled 2014-03-03: qty 1

## 2014-03-03 MED ORDER — METOCLOPRAMIDE HCL 5 MG/ML IJ SOLN
10.0000 mg | Freq: Once | INTRAMUSCULAR | Status: AC
  Administered 2014-03-03: 5 mg via INTRAVENOUS
  Filled 2014-03-03: qty 2

## 2014-03-03 MED ORDER — DIPHENHYDRAMINE HCL 50 MG/ML IJ SOLN
25.0000 mg | Freq: Once | INTRAMUSCULAR | Status: AC
  Administered 2014-03-03: 25 mg via INTRAVENOUS
  Filled 2014-03-03: qty 1

## 2014-03-03 MED ORDER — HYDROMORPHONE HCL PF 1 MG/ML IJ SOLN
1.0000 mg | Freq: Once | INTRAMUSCULAR | Status: AC
  Administered 2014-03-03: 1 mg via INTRAVENOUS
  Filled 2014-03-03: qty 1

## 2014-03-03 MED ORDER — KETOROLAC TROMETHAMINE 30 MG/ML IJ SOLN
30.0000 mg | Freq: Once | INTRAMUSCULAR | Status: AC
  Administered 2014-03-03: 30 mg via INTRAVENOUS
  Filled 2014-03-03: qty 1

## 2014-03-03 MED ORDER — PROMETHAZINE HCL 25 MG PO TABS: 25.0000 mg | ORAL_TABLET | Freq: Four times a day (QID) | ORAL | Status: DC | PRN

## 2014-03-03 NOTE — ED Notes (Signed)
Advised pt of discharge instructions and medications were reviewed.

## 2014-03-03 NOTE — ED Notes (Addendum)
Pt states she had a seizure and fell hitting her head on Tuesday being brought via ems to [facility]. Pt states her headache has gotten progressivley worse over the weak. And has also made her dizzy and N&V  Also states she has impacted teeth on the left side.

## 2014-03-03 NOTE — ED Provider Notes (Signed)
CSN: [id number]     Arrival date & time 03/03/14  0102 History   First MD Initiated Contact with Patient 03/03/14 0224     Chief Complaint  Patient presents with  . Headache     (Consider location/radiation/quality/duration/timing/severity/associated sxs/prior Treatment) Patient is a 28 y.o. female presenting with headaches. The history is provided by the patient (pt complains of a severe headace).  Headache Pain location:  Frontal Quality:  Dull Radiates to:  Does not radiate Severity currently:  8/10 Severity at highest:  9/10 Onset quality:  Sudden Timing:  Constant Progression:  Worsening Chronicity:  New Associated symptoms: no abdominal pain, no back pain, no congestion, no cough, no diarrhea, no fatigue, no seizures and no sinus pressure     Past Medical History  Diagnosis Date  . Seizures   . Anxiety   . Epilepsy    Past Surgical History  Procedure Laterality Date  . Cesarean section    . Colon surgery     Family History  Problem Relation Age of Onset  . Seizures Mother   . Diabetes Mother   . Heart failure Mother   . Cancer Father    History  Substance Use Topics  . Smoking status: Current Every Day Smoker -- 0.25 packs/day for 5 years    Types: Cigarettes  . Smokeless tobacco: Never Used  . Alcohol Use: Yes     Comment: occ   OB History   Grav Para Term Preterm Abortions TAB SAB Ect Mult Living   2 2  2      2      Review of Systems  Constitutional: Negative for appetite change and fatigue.  HENT: Negative for congestion, ear discharge and sinus pressure.   Eyes: Negative for discharge.  Respiratory: Negative for cough.   Cardiovascular: Negative for chest pain.  Gastrointestinal: Negative for abdominal pain and diarrhea.  Genitourinary: Negative for frequency and hematuria.  Musculoskeletal: Negative for back pain.  Skin: Negative for rash.  Neurological: Positive for headaches. Negative for seizures.  Psychiatric/Behavioral: Negative for  hallucinations.      Allergies  Azithromycin; Bactrim; Cephalexin; Keppra; Penicillins; Ultram; and Vimpat  Home Medications   Prior to Admission medications   Medication Sig Start Date End Date Taking? Authorizing Provider  phenytoin (DILANTIN) 100 MG ER capsule Take 200 mg by mouth 2 (two) times daily.    Yes Historical Provider, MD  HYDROcodone-acetaminophen (NORCO/VICODIN) 5-325 MG per tablet Take 1 tablet by mouth every 6 (six) hours as needed. 02/24/14   Benny Lennert, MD  oxyCODONE-acetaminophen (PERCOCET/ROXICET) 5-325 MG per tablet Take 1 tablet by mouth every 6 (six) hours as needed. 03/03/14   Benny Lennert, MD  promethazine (PHENERGAN) 25 MG tablet Take 1 tablet (25 mg total) by mouth every 6 (six) hours as needed for nausea or vomiting. 03/03/14   Benny Lennert, MD   LMP 01/28/2014 Physical Exam  Constitutional: She is oriented to person, place, and time. She appears well-developed.  HENT:  Head: Normocephalic.  brusing around left orbit  Eyes: Conjunctivae and EOM are normal. No scleral icterus.  Neck: Neck supple. No thyromegaly present.  Cardiovascular: Normal rate and regular rhythm.  Exam reveals no gallop and no friction rub.   No murmur heard. Pulmonary/Chest: No stridor. She has no wheezes. She has no rales. She exhibits no tenderness.  Abdominal: She exhibits no distension. There is no tenderness. There is no rebound.  Musculoskeletal: Normal range of motion. She exhibits no edema.  Lymphadenopathy:    She has no cervical adenopathy.  Neurological: She is oriented to person, place, and time. She exhibits normal muscle tone. Coordination normal.  Skin: No rash noted. No erythema.  Psychiatric: She has a normal mood and affect. Her behavior is normal.    ED Course  Procedures (including critical care time) Labs Review Labs Reviewed - No data to display  Imaging Review Ct Head Wo Contrast  03/03/2014   CLINICAL DATA:  Headache  EXAM: CT HEAD WITHOUT  CONTRAST  TECHNIQUE: Contiguous axial images were obtained from the base of the skull through the vertex without intravenous contrast.  COMPARISON:  Prior CT from 02/24/2014  FINDINGS: There is no acute intracranial hemorrhage or infarct. No mass lesion or midline shift. Gray-white matter differentiation is well maintained. Ventricles are normal in size without evidence of hydrocephalus. CSF containing spaces are within normal limits. No extra-axial fluid collection.  The calvarium is intact.  Orbital soft tissues are within normal limits.  The paranasal sinuses and mastoid air cells are well pneumatized and free of fluid.  Left frontal scalp hematoma is decreased in size as compared to prior study.  IMPRESSION: 1. No acute intracranial process. 2. Decreased size of left frontal scalp contusion.   Electronically Signed   By: Rise MuBenjamin  McClintock M.D.   On: 03/03/2014 05:35     EKG Interpretation None      MDM   Final diagnoses:  Head and face pain    Post traumatic headache.      Benny LennertJoseph L Alf Doyle, MD 03/03/14 [phone]

## 2014-03-03 NOTE — Discharge Instructions (Signed)
Follow up with your md this week. °

## 2014-03-17 NOTE — Telephone Encounter (Signed)
Several attempts have been made to contact patient and no answer.

## 2014-04-14 ENCOUNTER — Institutional Professional Consult (permissible substitution): Payer: Medicaid Other | Admitting: Neurology

## 2014-04-14 ENCOUNTER — Telehealth: Payer: Self-pay | Admitting: Neurology

## 2014-04-14 NOTE — Telephone Encounter (Signed)
This patient did not show for a new patient appointment today. 

## 2014-04-27 ENCOUNTER — Telehealth: Payer: Self-pay | Admitting: *Deleted

## 2014-04-27 NOTE — Telephone Encounter (Signed)
I called patient. We can try to get her worked in sooner.

## 2014-04-27 NOTE — Telephone Encounter (Signed)
Patient calling to r/s her appointment from 04/14/14 with Dr Anne HahnWillis, patient was scheduled for 06/23/14 at 11 am but would like to be seen sooner than this but needs a week to schedule a week in advance due to transportation. Please advise

## 2014-04-28 ENCOUNTER — Telehealth: Payer: Self-pay | Admitting: *Deleted

## 2014-04-28 NOTE — Telephone Encounter (Signed)
Dr. Anne HahnWillis has a 12 noon on Thursday 05/07/14. If patient calls please offer.

## 2014-04-29 NOTE — Telephone Encounter (Signed)
Appointment scheduled for 05-13-14.  Patient aware.

## 2014-05-13 ENCOUNTER — Ambulatory Visit: Payer: Self-pay | Admitting: Neurology

## 2014-05-13 ENCOUNTER — Telehealth: Payer: Self-pay | Admitting: Neurology

## 2014-05-13 NOTE — Telephone Encounter (Signed)
This patient did not show for a revisit appointment today.  This is the second no-show. 

## 2014-05-15 ENCOUNTER — Encounter: Payer: Self-pay | Admitting: *Deleted

## 2014-05-18 ENCOUNTER — Encounter: Payer: Self-pay | Admitting: *Deleted

## 2014-05-18 ENCOUNTER — Encounter (HOSPITAL_COMMUNITY): Payer: Self-pay | Admitting: Emergency Medicine

## 2014-06-23 ENCOUNTER — Ambulatory Visit (INDEPENDENT_AMBULATORY_CARE_PROVIDER_SITE_OTHER): Payer: Medicaid Other | Admitting: Neurology

## 2014-06-23 ENCOUNTER — Encounter: Payer: Self-pay | Admitting: Neurology

## 2014-06-23 VITALS — BP 104/73 | HR 71 | Ht 63.0 in | Wt 246.0 lb

## 2014-06-23 DIAGNOSIS — G40909 Epilepsy, unspecified, not intractable, without status epilepticus: Secondary | ICD-10-CM

## 2014-06-23 DIAGNOSIS — R51 Headache: Secondary | ICD-10-CM

## 2014-06-23 DIAGNOSIS — R569 Unspecified convulsions: Secondary | ICD-10-CM | POA: Insufficient documentation

## 2014-06-23 DIAGNOSIS — G44221 Chronic tension-type headache, intractable: Secondary | ICD-10-CM

## 2014-06-23 DIAGNOSIS — R519 Headache, unspecified: Secondary | ICD-10-CM | POA: Insufficient documentation

## 2014-06-23 HISTORY — DX: Unspecified convulsions: R56.9

## 2014-06-23 MED ORDER — TOPIRAMATE 25 MG PO TABS: ORAL_TABLET | ORAL | Status: DC

## 2014-06-23 MED ORDER — PHENYTOIN SODIUM EXTENDED 100 MG PO CAPS: 200.0000 mg | ORAL_CAPSULE | Freq: Two times a day (BID) | ORAL | Status: DC

## 2014-06-23 NOTE — Progress Notes (Signed)
Reason for visit: Seizures  Whitney Scott is a 28 y.o. female  History of present illness:  Whitney Scott is a 28 year old right-handed white female with primarily nocturnal seizures. The patient has been on Dilantin for number of years, but she has never been able to get adequate blood levels, even with doses up to 600 mg daily. The patient currently is on 400 mg daily, running blood levels of less than 2.5. The patient is having on average 4 seizures a year. Three months ago, the patient had a seizure associated with sleep walking, she went outside of the house, and then fell down, fracturing her nose. The patient reports daily headaches that are oftentimes coming on in the middle of the night, or in the morning. The patient does not tend to have headaches as much during the day. She is not operating a motor vehicle. She indicates that she has difficulty getting adequate rest. She does not sleep well during the nighttime hours. She has been followed previously by Dr. Judithann Sheenoonqua, but she is transferring care to this office. She has been seen here previously in 2011. There is a history of seizures in the patient's mother and a maternal aunt.  Past Medical History  Diagnosis Date  . Seizures   . Anxiety   . Epilepsy   . Nocturnal seizures 06/23/2014  . Obesity   . Endometriosis   . Head ache 06/23/2014    Past Surgical History  Procedure Laterality Date  . Cesarean section    . Colon surgery    . Tubal ligation Bilateral     Family History  Problem Relation Age of Onset  . Seizures Mother   . Diabetes Mother   . Heart failure Mother   . Cancer Father   . Seizures Maternal Uncle     Social history:  reports that she has quit smoking. Her smoking use included Cigarettes. She has a 1.25 pack-year smoking history. She has never used smokeless tobacco. She reports that she drinks alcohol. She reports that she does not use illicit drugs.  Medications:  Current Outpatient Prescriptions  on File Prior to Visit  Medication Sig Dispense Refill  . [DISCONTINUED] DULoxetine (CYMBALTA) 30 MG capsule Take 30 mg by mouth daily.     No current facility-administered medications on file prior to visit.      Allergies  Allergen Reactions  . Azithromycin   . Bactrim Nausea And Vomiting  . Cephalexin Nausea And Vomiting  . Keppra [Levetiracetam] Nausea And Vomiting    Altered mental state, nervousness  . Penicillins Other (See Comments)    Child hood allergy  . Ultram [Tramadol]   . Vimpat [Lacosamide] Nausea And Vomiting and Other (See Comments)    Altered mental state, nervousness    ROS:  Out of a complete 14 system review of symptoms, the patient complains only of the following symptoms, and all other reviewed systems are negative.  Fatigue Ringing in the ears Blurred vision Insomnia Memory loss, dizziness, headache, seizures, tremors Agitation  Blood pressure 104/73, pulse 71, height 5\' 3"  (1.6 m), weight 246 lb (111.585 kg).  Physical Exam  General: The patient is alert and cooperative at the time of the examination. The patient is markedly obese.  Eyes: Pupils are equal, round, and reactive to light. Discs are flat bilaterally.  Neck: The neck is supple, no carotid bruits are noted.  Respiratory: The respiratory examination is clear.  Cardiovascular: The cardiovascular examination reveals a regular rate and rhythm,  no obvious murmurs or rubs are noted.  Skin: Extremities are without significant edema.  Neurologic Exam  Mental status: The patient is alert and oriented x 3 at the time of the examination. The patient has apparent normal recent and remote memory, with an apparently normal attention span and concentration ability.  Cranial nerves: Facial symmetry is present. There is good sensation of the face to pinprick and soft touch bilaterally. The strength of the facial muscles and the muscles to head turning and shoulder shrug are normal bilaterally.  Speech is well enunciated, no aphasia or dysarthria is noted. Extraocular movements are full. Visual fields are full. The tongue is midline, and the patient has symmetric elevation of the soft palate. No obvious hearing deficits are noted.  Motor: The motor testing reveals 5 over 5 strength of all 4 extremities. Good symmetric motor tone is noted throughout.  Sensory: Sensory testing is intact to pinprick, soft touch, vibration sensation, and position sense on all 4 extremities. No evidence of extinction is noted.  Coordination: Cerebellar testing reveals good finger-nose-finger and heel-to-shin bilaterally.  Gait and station: Gait is normal. Tandem gait is normal. Romberg is negative. No drift is seen.  Reflexes: Deep tendon reflexes are symmetric and normal bilaterally. Toes are downgoing bilaterally.   Assessment/Plan:  1. Obesity  2. Nocturnal seizures  3. Daily headache  The patient has not been able to get adequate blood levels with the use of Dilantin. She will be placed on Topamax to help treat the headache and the seizures. If she does well with this, the Dilantin will be tapered off and discontinued. The patient may require a sleep study in the future to exclude the possibility of sleep apnea. She will follow-up in 3-4 months.  Marlan Palau. Keith Willis MD 06/23/2014 6:53 PM  [facility] [address] [place], [place] [id number]  Phone [phone] Fax [phone]

## 2014-06-23 NOTE — Patient Instructions (Signed)

## 2014-10-27 ENCOUNTER — Ambulatory Visit: Payer: Medicaid Other | Admitting: Adult Health

## 2014-10-30 ENCOUNTER — Encounter: Payer: Self-pay | Admitting: Neurology

## 2015-06-24 ENCOUNTER — Emergency Department (HOSPITAL_COMMUNITY)
Admission: EM | Admit: 2015-06-24 | Discharge: 2015-06-24 | Disposition: A | Discharge status: Home / Self Care | Payer: Medicaid Other | Attending: Emergency Medicine | Admitting: Emergency Medicine

## 2015-06-24 ENCOUNTER — Encounter (HOSPITAL_COMMUNITY): Payer: Self-pay | Admitting: Emergency Medicine

## 2015-06-24 ENCOUNTER — Emergency Department (HOSPITAL_COMMUNITY)
Admission: EM | Admit: 2015-06-24 | Discharge: 2015-06-24 | Disposition: A | Discharge status: Home / Self Care | Payer: Medicaid Other | Source: Home / Self Care | Attending: Emergency Medicine | Admitting: Emergency Medicine

## 2015-06-24 ENCOUNTER — Encounter (HOSPITAL_COMMUNITY): Payer: Self-pay

## 2015-06-24 DIAGNOSIS — Z880 Allergy status to penicillin: Secondary | ICD-10-CM

## 2015-06-24 DIAGNOSIS — Z79899 Other long term (current) drug therapy: Secondary | ICD-10-CM | POA: Insufficient documentation

## 2015-06-24 DIAGNOSIS — R51 Headache: Secondary | ICD-10-CM

## 2015-06-24 DIAGNOSIS — E669 Obesity, unspecified: Secondary | ICD-10-CM

## 2015-06-24 DIAGNOSIS — Z8742 Personal history of other diseases of the female genital tract: Secondary | ICD-10-CM | POA: Insufficient documentation

## 2015-06-24 DIAGNOSIS — K146 Glossodynia: Secondary | ICD-10-CM | POA: Insufficient documentation

## 2015-06-24 DIAGNOSIS — Z87891 Personal history of nicotine dependence: Secondary | ICD-10-CM | POA: Diagnosis not present

## 2015-06-24 DIAGNOSIS — G40909 Epilepsy, unspecified, not intractable, without status epilepticus: Secondary | ICD-10-CM

## 2015-06-24 DIAGNOSIS — R569 Unspecified convulsions: Secondary | ICD-10-CM

## 2015-06-24 DIAGNOSIS — F419 Anxiety disorder, unspecified: Secondary | ICD-10-CM | POA: Insufficient documentation

## 2015-06-24 LAB — BASIC METABOLIC PANEL
Anion gap: 11 (ref 5–15)
BUN: 7 mg/dL (ref 6–20)
CHLORIDE: 104 mmol/L (ref 101–111)
CO2: 21 mmol/L — ABNORMAL LOW (ref 22–32)
Calcium: 9 mg/dL (ref 8.9–10.3)
Creatinine, Ser: 0.75 mg/dL (ref 0.44–1.00)
GFR calc Af Amer: >60 mL/min (ref >60)
GFR calc non Af Amer: >60 mL/min (ref >60)
GLUCOSE: 98 mg/dL (ref 65–99)
POTASSIUM: 3.6 mmol/L (ref 3.5–5.1)
Sodium: 136 mmol/L (ref 135–145)

## 2015-06-24 LAB — I-STAT BETA HCG BLOOD, ED (MC, WL, AP ONLY): I-stat hCG, quantitative: <5 m[IU]/mL (ref <5)

## 2015-06-24 MED ORDER — ACETAMINOPHEN 500 MG PO TABS
1000.0000 mg | ORAL_TABLET | Freq: Once | ORAL | Status: AC
  Administered 2015-06-24: 1000 mg via ORAL
  Filled 2015-06-24: qty 2

## 2015-06-24 MED ORDER — PHENYTOIN SODIUM EXTENDED 100 MG PO CAPS
400.0000 mg | ORAL_CAPSULE | Freq: Once | ORAL | Status: AC
  Administered 2015-06-24: 400 mg via ORAL
  Filled 2015-06-24: qty 4

## 2015-06-24 MED ORDER — TOPIRAMATE 25 MG PO TABS
ORAL_TABLET | ORAL | Status: AC
  Filled 2015-06-24: qty 1

## 2015-06-24 MED ORDER — SODIUM CHLORIDE 0.9 % IV BOLUS (SEPSIS)
1000.0000 mL | Freq: Once | INTRAVENOUS | Status: AC
  Administered 2015-06-24: 1000 mL via INTRAVENOUS

## 2015-06-24 MED ORDER — HYDROMORPHONE HCL 1 MG/ML IJ SOLN
1.0000 mg | Freq: Once | INTRAMUSCULAR | Status: AC
  Administered 2015-06-24: 1 mg via INTRAMUSCULAR
  Filled 2015-06-24: qty 1

## 2015-06-24 MED ORDER — PHENYTOIN SODIUM EXTENDED 100 MG PO CAPS
200.0000 mg | ORAL_CAPSULE | Freq: Once | ORAL | Status: AC
Start: 2015-06-24 — End: 2015-06-24
  Administered 2015-06-24: 200 mg via ORAL
  Filled 2015-06-24: qty 2

## 2015-06-24 MED ORDER — TOPIRAMATE 25 MG PO TABS
25.0000 mg | ORAL_TABLET | Freq: Once | ORAL | Status: AC
  Administered 2015-06-24: 25 mg via ORAL
  Filled 2015-06-24: qty 1

## 2015-06-24 MED ORDER — FENTANYL CITRATE (PF) 100 MCG/2ML IJ SOLN
50.0000 ug | Freq: Once | INTRAMUSCULAR | Status: AC
  Administered 2015-06-24: 50 ug via INTRAVENOUS
  Filled 2015-06-24: qty 2

## 2015-06-24 MED ORDER — KETOROLAC TROMETHAMINE 30 MG/ML IJ SOLN
30.0000 mg | Freq: Once | INTRAMUSCULAR | Status: AC
  Administered 2015-06-24: 30 mg via INTRAVENOUS
  Filled 2015-06-24: qty 1

## 2015-06-24 NOTE — ED Notes (Signed)
Pt reports walking to bathroom when she had another seizure. Mom found patient in the bathroom. Upon EMS arrival, pt was alert and could follow commands, but seemed confused. Pt d/c from [facility] an hour ago for same complaint.

## 2015-06-24 NOTE — ED Notes (Signed)
Discharge instructions given, pt demonstrated teach back and verbal understanding. No concerns voiced.  

## 2015-06-24 NOTE — ED Provider Notes (Signed)
CSN: [id number]     Arrival date & time 06/24/15  1346 History   First MD Initiated Contact with Patient 06/24/15 1352     Chief Complaint  Patient presents with  . Seizures     (Consider location/radiation/quality/duration/timing/severity/associated sxs/prior Treatment) HPI Patient presents after a seizure. Patient recalls awakening this morning, having a typical day, then awoke with care providers over her. She states that she currently has a diffuse headache, tongue pain Headache is typical for her after seizures. She denies any weakness anywhere, confusion, disorientation, but does acknowledge throat fatigue. She states that he takes all medication as directed, and has had multiple seizures in the past in spite of medication complaints. She denies other substantial medical problems.  Past Medical History  Diagnosis Date  . Seizures (HCC)   . Anxiety   . Epilepsy (HCC)   . Nocturnal seizures (HCC) 06/23/2014  . Obesity   . Endometriosis   . Head ache 06/23/2014   Past Surgical History  Procedure Laterality Date  . Cesarean section    . Colon surgery    . Tubal ligation Bilateral    Family History  Problem Relation Age of Onset  . Seizures Mother   . Diabetes Mother   . Heart failure Mother   . Cancer Father   . Seizures Maternal Uncle    Social History  Substance Use Topics  . Smoking status: Former Smoker -- 0.25 packs/day for 5 years    Types: Cigarettes  . Smokeless tobacco: Never Used  . Alcohol Use: Yes     Comment: occ   OB History    Gravida Para Term Preterm AB TAB SAB Ectopic Multiple Living   2 2  2      2      Review of Systems  Constitutional:       Per HPI, otherwise negative  HENT:       Per HPI, otherwise negative  Respiratory:       Per HPI, otherwise negative  Cardiovascular:       Per HPI, otherwise negative  Gastrointestinal: Negative for vomiting.  Endocrine:       Negative aside from HPI  Genitourinary:       Neg aside from HPI    Musculoskeletal:       Per HPI, otherwise negative  Skin: Negative.   Neurological: Positive for seizures and headaches. Negative for syncope.      Allergies  Azithromycin; Bactrim; Cephalexin; Keppra; Penicillins; Ultram; and Vimpat  Home Medications   Prior to Admission medications   Medication Sig Start Date End Date Taking? Authorizing Provider  phenytoin (DILANTIN) 100 MG ER capsule Take 2 capsules (200 mg total) by mouth 2 (two) times daily. 06/23/14   York Spanielharles K Willis, MD  topiramate (TOPAMAX) 25 MG tablet One tablet twice a day for 2 weeks, then take 2 tablets twice a day 06/23/14   York Spanielharles K Willis, MD   BP 105/72 mmHg  Pulse 72  Temp(Src) 97.8 F (36.6 C) (Oral)  Resp 22  SpO2 97% Physical Exam  Constitutional: She is oriented to person, place, and time. She appears well-developed and well-nourished. No distress.  HENT:  Head: Normocephalic and atraumatic.  Non-bleeding lac on anterior tongue Piercing in place as well  Eyes: Conjunctivae and EOM are normal.  Cardiovascular: Normal rate and regular rhythm.   Pulmonary/Chest: Effort normal and breath sounds normal. No stridor. No respiratory distress.  Abdominal: She exhibits no distension.  Musculoskeletal: She exhibits no edema.  Neurological: She is alert and oriented to person, place, and time. She displays no atrophy and no tremor. No cranial nerve deficit. She exhibits normal muscle tone. She displays no seizure activity. Coordination normal.  Skin: Skin is warm and dry.  Psychiatric: She has a normal mood and affect.  Nursing note and vitals reviewed.   ED Course  Procedures (including critical care time) Labs Review Labs Reviewed  BASIC METABOLIC PANEL - Abnormal; Notable for the following:    CO2 21 (*)    All other components within normal limits  I-STAT BETA HCG BLOOD, ED ([facility], [facility], [facility] ONLY)    Imaging Review No results found. I have personally reviewed and evaluated these images and lab results  as part of my medical decision-making.   EKG Interpretation   Date/Time:  Thursday June 24 2015 13:49:09 EST Ventricular Rate:  91 PR Interval:  155 QRS Duration: 86 QT Interval:  372 QTC Calculation: 458 R Axis:   55 Text Interpretation:  Sinus rhythm Low voltage, precordial leads Sinus  rhythm Low voltage QRS Borderline ECG Confirmed by Gerhard Munch  MD  ([id number]) on 06/24/2015 2:02:59 PM      Pulse oximetry 99% room air normal  Chart review notable for change in antiepileptics one year ago from Dilantin Topamax   On repeat exam the patient is awakening. No new complaints, but she continues to have mild headache.    Update:, Patient continues to complain of headache. Now 4 hours of monitoring, no decompensation. She, her mother and I discussed all findings, including breakthrough seizures in spite of taking medication as directed. Patient will follow up with her neurologist. MDM  Patient presents after a seizure. Patient history of seizures, including breakthrough seizures in spite of being on the appropriate medication. Here, no evidence for acute new pathology, and the patient improved here with fluid resuscitation, analgesia. Patient discharged in stable condition to follow-up with neurology.   Gerhard Munch, MD 06/24/15 [phone]

## 2015-06-24 NOTE — ED Notes (Signed)
Pt reports persistent headache and is requesting more pain medication. MD Jeraldine LootsLockwood notified.

## 2015-06-24 NOTE — Discharge Instructions (Signed)
As discussed, it is important that you follow up as soon as possible with your physician for continued management of your condition. ° °If you develop any new, or concerning changes in your condition, please return to the emergency department immediately. ° °

## 2015-06-24 NOTE — ED Notes (Signed)
Pt requesting medication for a headache. MD Jeraldine LootsLockwood notified.

## 2015-06-24 NOTE — ED Notes (Signed)
Pt here for evaluation of seizure. Pt is slightly confused on arrival. States she has a history but it has been over a year since she had a seizure.

## 2015-06-24 NOTE — ED Notes (Signed)
Seizure pads applied to rails.

## 2015-06-24 NOTE — ED Provider Notes (Signed)
CSN: [id number]     Arrival date & time 06/24/15  1837 History   First MD Initiated Contact with Patient 06/24/15 1849     Chief Complaint  Patient presents with  . Seizures    HPI   patient presents after having a seizure. Notably, the patient was here earlier today, evaluated, discharge, and states that on discharge she was doing generally well. However, after the patient went home, the patient had a witnessed seizure. Patient presents with her mother, who assist with the history of present illness. EMS notes that on their initial evaluation the patient was alert, following commands, but seemed postictal. Patient currently has diffuse head pain, but denies weakness anywhere, confusion, difficulty swallowing, speaking, breathing. She does have ongoing tongue pain, consistent with earlier today.   Past Medical History  Diagnosis Date  . Seizures (HCC)   . Anxiety   . Epilepsy (HCC)   . Nocturnal seizures (HCC) 06/23/2014  . Obesity   . Endometriosis   . Head ache 06/23/2014   Past Surgical History  Procedure Laterality Date  . Cesarean section    . Colon surgery    . Tubal ligation Bilateral    Family History  Problem Relation Age of Onset  . Seizures Mother   . Diabetes Mother   . Heart failure Mother   . Cancer Father   . Seizures Maternal Uncle    Social History  Substance Use Topics  . Smoking status: Former Smoker -- 0.25 packs/day for 5 years    Types: Cigarettes  . Smokeless tobacco: Never Used  . Alcohol Use: Yes     Comment: occ   OB History    Gravida Para Term Preterm AB TAB SAB Ectopic Multiple Living   Review of Systems  Constitutional:       Per HPI, otherwise negative  HENT:       Per HPI, otherwise negative  Respiratory:       Per HPI, otherwise negative  Cardiovascular:       Per HPI, otherwise negative  Gastrointestinal: Negative for vomiting.  Endocrine:       Negative aside from HPI  Genitourinary:       Neg aside  from HPI   Musculoskeletal:       Per HPI, otherwise negative  Skin: Negative.   Neurological: Positive for seizures and headaches. Negative for syncope.      Allergies  Azithromycin; Bactrim; Cephalexin; Keppra; Penicillins; Ultram; and Vimpat  Home Medications   Prior to Admission medications   Medication Sig Start Date End Date Taking? Authorizing Provider  phenytoin (DILANTIN) 100 MG ER capsule Take 2 capsules (200 mg total) by mouth 2 (two) times daily. 06/23/14   York Spaniel, MD  topiramate (TOPAMAX) 25 MG tablet One tablet twice a day for 2 weeks, then take 2 tablets twice a day 06/23/14   York Spaniel, MD   BP 101/65 mmHg  Pulse 85  Temp(Src) 97.8 F (36.6 C) (Oral)  Resp 18  SpO2 95% Physical Exam  Constitutional: She is oriented to person, place, and time. She appears well-developed and well-nourished. No distress.  HENT:  Head: Normocephalic and atraumatic.  Non-bleeding lac on anterior tongue Piercing in place as well  Eyes: Conjunctivae and EOM are normal.  Cardiovascular: Normal rate and regular rhythm.   Pulmonary/Chest: Effort normal and breath sounds normal. No stridor. No respiratory distress.  Abdominal: She  exhibits no distension.  Musculoskeletal: She exhibits no edema.  Neurological: She is alert and oriented to person, place, and time. She displays no atrophy and no tremor. No cranial nerve deficit. She exhibits normal muscle tone. She displays no seizure activity. Coordination normal.  Skin: Skin is warm and dry.  Psychiatric: She has a normal mood and affect.  Nursing note and vitals reviewed.   ED Course  Procedures (including critical care time)   Labs from her earlier today reviewed with patient and her mother.  It is now clear that the patient is taking both Topamax and Dilantin for seizure control whereas before, the patient stated that she was taking only Topamax.  With this new information, the patient will have Dilantin loading  dose. She will also receive analgesia for her headache.  ,   MDM   Patient presents after a witnessed seizure. Notably, the patient has known seizure disorder, was seen here earlier today after another witnessed seizure.  Again, the patient is Up from postictal phase, moving all extremity spontaneously, oriented appropriately, with no evidence for other acute new abscesses. Patient received analgesia for her headache, and given the new information about her seizure medication, received a loading dose in addition to her typical home dose of Topamax. After a period of observation for several hours, with no additional seizure activity, she was discharged to follow up with neurology tomorrow.  Gerhard Munchobert Charlen Bakula, MD 06/24/15 2109

## 2015-07-14 ENCOUNTER — Other Ambulatory Visit (HOSPITAL_COMMUNITY): Payer: Self-pay | Admitting: Respiratory Therapy

## 2015-07-14 DIAGNOSIS — G4733 Obstructive sleep apnea (adult) (pediatric): Secondary | ICD-10-CM

## 2015-09-10 ENCOUNTER — Encounter (HOSPITAL_COMMUNITY): Payer: Self-pay | Admitting: Emergency Medicine

## 2015-09-10 ENCOUNTER — Emergency Department (HOSPITAL_COMMUNITY)
Admission: EM | Admit: 2015-09-10 | Discharge: 2015-09-10 | Disposition: A | Discharge status: Home / Self Care | Payer: Medicaid Other | Attending: Emergency Medicine | Admitting: Emergency Medicine

## 2015-09-10 DIAGNOSIS — Z8742 Personal history of other diseases of the female genital tract: Secondary | ICD-10-CM | POA: Diagnosis not present

## 2015-09-10 DIAGNOSIS — Z881 Allergy status to other antibiotic agents status: Secondary | ICD-10-CM | POA: Diagnosis not present

## 2015-09-10 DIAGNOSIS — Z79899 Other long term (current) drug therapy: Secondary | ICD-10-CM | POA: Diagnosis not present

## 2015-09-10 DIAGNOSIS — Z888 Allergy status to other drugs, medicaments and biological substances status: Secondary | ICD-10-CM | POA: Insufficient documentation

## 2015-09-10 DIAGNOSIS — G40909 Epilepsy, unspecified, not intractable, without status epilepticus: Secondary | ICD-10-CM | POA: Insufficient documentation

## 2015-09-10 DIAGNOSIS — Z87891 Personal history of nicotine dependence: Secondary | ICD-10-CM | POA: Diagnosis not present

## 2015-09-10 DIAGNOSIS — R569 Unspecified convulsions: Secondary | ICD-10-CM

## 2015-09-10 DIAGNOSIS — E669 Obesity, unspecified: Secondary | ICD-10-CM | POA: Insufficient documentation

## 2015-09-10 DIAGNOSIS — R51 Headache: Secondary | ICD-10-CM | POA: Insufficient documentation

## 2015-09-10 HISTORY — DX: Headache, unspecified: R51.9

## 2015-09-10 HISTORY — DX: Headache: R51

## 2015-09-10 LAB — PHENYTOIN LEVEL, TOTAL

## 2015-09-10 MED ORDER — METOCLOPRAMIDE HCL 5 MG/ML IJ SOLN
10.0000 mg | Freq: Once | INTRAMUSCULAR | Status: AC
  Administered 2015-09-10: 10 mg via INTRAVENOUS
  Filled 2015-09-10: qty 2

## 2015-09-10 MED ORDER — DIPHENHYDRAMINE HCL 25 MG PO CAPS
50.0000 mg | ORAL_CAPSULE | Freq: Once | ORAL | Status: AC
  Administered 2015-09-10: 50 mg via ORAL
  Filled 2015-09-10: qty 2

## 2015-09-10 MED ORDER — TOPIRAMATE 25 MG PO TABS: 50.0000 mg | ORAL_TABLET | Freq: Once | ORAL | Status: DC

## 2015-09-10 MED ORDER — KETOROLAC TROMETHAMINE 30 MG/ML IJ SOLN
30.0000 mg | Freq: Once | INTRAMUSCULAR | Status: AC
  Administered 2015-09-10: 30 mg via INTRAVENOUS
  Filled 2015-09-10: qty 1

## 2015-09-10 NOTE — ED Notes (Signed)
Per EMS, pt had a witnessed seizure. Pt has hx of seizures and has taken Dilantin for that past 6-7 years. Pt recently switched to Trokendi XR in December. Mom told EMS this is the first seizure she has had in 6 months. Upon EMS arrival, pt post-ictal. Pt bit tongue and vomited. Pt has become more alert and oriented. VSS

## 2015-09-10 NOTE — ED Provider Notes (Signed)
CSN: [id number]     Arrival date & time 09/10/15  1430 History   First MD Initiated Contact with Patient 09/10/15 1555     Chief Complaint  Patient presents with  . Seizures      HPI Pt was seen at 1605. Per EMS, pt's family and pt report, c/o sudden onset and resolution of one episode of generalized seizure that occurred PTA. Pt was sleeping "and cried out," per her mother. When she went to check on pt, pt was "having one of her seizures." Pt urinated on herself and bit her tongue. Pt has significant hx of same. EMS noted pt was post-ictal on scene, with gradual return to A&O/baseline while en route to the ED. Pt states she was seen by her Neuro Dr. Gerilyn Pilgrim in 06/2015 and told to stop taking her dilantin and to start taking topamax. Pt has not taken her topamax ER today, but states she has otherwise been taking the topamax regularly and "takes some dilantin every once in a while." Pt's only complaint on arrival to ED is acute flair of her chronic headache. Describes the headache as per her usual chronic headache pain pattern for many years.  Denies headache was sudden or maximal in onset or at any time.  Denies visual changes, no focal motor weakness, no tingling/numbness in extremities, no fevers, no neck pain, no rash, no injury.     Neuro: Dr. Gerilyn Pilgrim Past Medical History  Diagnosis Date  . Seizures (HCC)   . Anxiety   . Epilepsy (HCC)   . Nocturnal seizures (HCC) 06/23/2014  . Obesity   . Endometriosis   . Chronic headache    Past Surgical History  Procedure Laterality Date  . Cesarean section    . Colon surgery    . Tubal ligation Bilateral    Family History  Problem Relation Age of Onset  . Seizures Mother   . Diabetes Mother   . Heart failure Mother   . Cancer Father   . Seizures Maternal Uncle    Social History  Substance Use Topics  . Smoking status: Former Smoker -- 0.25 packs/day for 5 years    Types: Cigarettes  . Smokeless tobacco: Never Used  . Alcohol Use:  Yes     Comment: occ   OB History    Gravida Para Term Preterm AB TAB SAB Ectopic Multiple Living   Review of Systems ROS: Statement: All systems negative except as marked or noted in the HPI; Constitutional: Negative for fever and chills. ; ; Eyes: Negative for eye pain, redness and discharge. ; ; ENMT: Negative for ear pain, hoarseness, nasal congestion, sinus pressure and sore throat. ; ; Cardiovascular: Negative for chest pain, palpitations, diaphoresis, dyspnea and peripheral edema. ; ; Respiratory: Negative for cough, wheezing and stridor. ; ; Gastrointestinal: Negative for nausea, vomiting, diarrhea, abdominal pain, blood in stool, hematemesis, jaundice and rectal bleeding. . ; ; Genitourinary: Negative for dysuria, flank pain and hematuria. ; ; Musculoskeletal: Negative for back pain and neck pain. Negative for swelling and trauma.; ; Skin: Negative for pruritus, rash, abrasions, blisters, bruising and skin lesion.; ; Neuro: Negative for lightheadedness and neck stiffness. Negative for weakness, extremity weakness, paresthesias, +seizure, chronic headache.      Allergies  Azithromycin; Bactrim; Cephalexin; Keppra; Penicillins; Ultram; and Vimpat  Home Medications   Prior to Admission medications   Medication Sig Start Date End Date Taking? Authorizing Provider  phenytoin (DILANTIN) 100 MG ER capsule Take 2 capsules (200 mg total) by mouth 2 (two) times daily. 06/23/14  Yes York Spaniel, MD  Topiramate ER (TROKENDI XR) 50 MG CP24 Take 2 tablets by mouth daily. 06/17/15  Yes Beryle Beams, MD   BP 95/63 mmHg  Pulse 78  Temp(Src) 98.2 F (36.8 C) (Oral)  Resp 15  Ht  (1.575 m)  Wt 226 lb (102.513 kg)  BMI 41.33 kg/m2  SpO2 97% Physical Exam  1610: Physical examination:  Nursing notes reviewed; Vital signs and O2 SAT reviewed;  Constitutional: Well developed, Well nourished, Well hydrated, In no acute distress; Head:  Normocephalic, atraumatic; Eyes:  EOMI, PERRL, No scleral icterus; ENMT: Mouth and pharynx normal, Mucous membranes moist; Neck: Supple, Full range of motion, No lymphadenopathy; Cardiovascular: Regular rate and rhythm, No murmur, rub, or gallop; Respiratory: Breath sounds clear & equal bilaterally, No rales, rhonchi, wheezes.  Speaking full sentences with ease, Normal respiratory effort/excursion; Chest: Nontender, Movement normal; Abdomen: Soft, Nontender, Nondistended, Normal bowel sounds; Genitourinary: No CVA tenderness; Extremities: Pulses normal, No tenderness, No edema, No calf edema or asymmetry.; Neuro: AA&Ox3, Major CN grossly intact.  Speech clear. No gross focal motor or sensory deficits in extremities.; Skin: Color normal, Warm, Dry.   ED Course  Procedures (including critical care time) Labs Review  Imaging Review  I have personally reviewed and evaluated these images and lab results as part of my medical decision-making.   EKG Interpretation   Date/Time:  Friday September 10 2015 14:45:58 EST Ventricular Rate:  103 PR Interval:  167 QRS Duration: 72 QT Interval:  384 QTC Calculation: 503 R Axis:   43 Text Interpretation:  Sinus tachycardia Low voltage, precordial leads  Borderline T abnormalities, anterior leads Prolonged QT interval Baseline  wander Artifact When compared with ECG of 02/24/2014 QT has lengthened  Otherwise no significant change Confirmed by [facility]  MD, Nicholos Johns [phone])  on 09/10/2015 4:31:52 PM      MDM  MDM Reviewed: previous chart, nursing note and vitals Reviewed previous: ECG Interpretation: ECG and labs      Results for orders placed or performed during the hospital encounter of 09/10/15  Phenytoin level, total  Result Value Ref Range   Phenytoin Lvl <2.5 (L) 10.0 - 20.0 ug/mL     1700:  T/C to Neurology Dr. Gerilyn Pilgrim, case discussed, including:  HPI, pertinent PM/SHx, VS/PE, dx testing, ED course and treatment:  Agrees pt is no longer taking dilantin and do not  restart, requests to have pt take 3 tabs of her trokendi XR daily (  total), have pt f/u in office next week. Pt treated for her chronic headache with improvement. Pt currently sleeping, resps easy, NAD, easily arousable to her name. States she is ready to go home now. Will have her take her topamax ER (3 tabs PO) before she leaves. Dx and testing, as well as d/w Neuro MD, d/w pt and family.  Questions answered.  Verb understanding, agreeable to d/c home with outpt f/u.   Samuel Jester, DO 09/14/15 1223

## 2015-09-10 NOTE — ED Notes (Signed)
This RN witnessed pt taking 150 mg (3 tablets) of her Topamax she brought from home per Dr. Clarene Duke orders.

## 2015-09-10 NOTE — Discharge Instructions (Signed)
°Emergency Department Resource Guide °1) Find a Doctor and Pay Out of Pocket °Although you won't have to find out who is covered by your insurance plan, it is a good idea to ask around and get recommendations. You will then need to call the office and see if the doctor you have chosen will accept you as a new patient and what types of options they offer for patients who are self-pay. Some doctors offer discounts or will set up payment plans for their patients who do not have insurance, but you will need to ask so you aren't surprised when you get to your appointment. ° °2) Contact Your Local Health Department °Not all health departments have doctors that can see patients for sick visits, but many do, so it is worth a call to see if yours does. If you don't know where your local health department is, you can check in your phone book. The CDC also has a tool to help you locate your state's health department, and many state websites also have listings of all of their local health departments. ° °3) Find a Walk-in Clinic °If your illness is not likely to be very severe or complicated, you may want to try a walk in clinic. These are popping up all over the country in pharmacies, drugstores, and shopping centers. They're usually staffed by nurse practitioners or physician assistants that have been trained to treat common illnesses and complaints. They're usually fairly quick and inexpensive. However, if you have serious medical issues or chronic medical problems, these are probably not your best option. ° °No Primary Care Doctor: °- Call [facility] Connect at  832-8000 - they can help you locate a primary care doctor that  accepts your insurance, provides certain services, etc. °- Physician Referral Service- 1-800-533-3463 ° °Chronic Pain Problems: °Organization         Address  Phone   Notes  °[facility]  (336) [phone] Patients need to be referred by their primary care doctor.  ° °Medication  Assistance: °Organization         Address  Phone   Notes  °[place] [facility] [address] °[place], [place] [address] (336) 641-8030 --Must be a resident of [place] °-- Must have NO insurance coverage whatsoever (no Medicaid/ Medicare, etc.) °-- The pt. MUST have a primary care doctor that directs their care regularly and follows them in the community °  °[facility]  (866) 331-1348   °[employer]  (888) 892-1162   ° °Agencies that provide inexpensive medical care: °Organization         Address  Phone   Notes  °[facility]  (336) 832-8035   °[facility]    (336) 832-7272   °[facility] [address] °[place], [place] [address] (336) 832-4777   °[facility] [address], °[place] (336) 271-4999   °[facility]    (336) 373-0678   °[facility]    (336) 272-1050   °[facility] ° [address], [place] Phone:  (336) 832-4444, Fax:  (336) 832-4440 Hours of Operation:  9 am - 6 pm, M-F.  Also accepts Medicaid/Medicare and self-pay.  °[facility] ° [address], [place] Phone: (336) [phone], Fax: (336) [phone]. Hours of Operation:  8:30 am - 5:30 pm, M-F.  Also accepts Medicaid and self-pay.  °[facility] [place] [address], [place] Phone: (336) 878-6027   °[facility] [address], [place], [place] (336)723-1848, Ext. 123 Mondays & Thursdays: 7-9 AM.  First 15 patients are seen on a first come, first serve basis. °  ° °Medicaid-accepting [place] Providers: ° °Organization         Address  Phone   Notes  °[facility] [address], [place] (336) 641-2100 Also accepts self-pay patients.  °[facility] [address], [place] ° (336) 856-9996   °[facility] [address], [place]  (336) 288-8857   °[facility] [address] [place] [address], [place] (336) 299-7000   °Veita Bland [address], [place]  ° (336) 373-1557 Only accepts [place] Access Medicaid patients after they have their name applied to their card.  ° °Self-Pay (no insurance) in [place]: ° °Organization         Address  Phone   Notes  °Sickle Cell Patients, [facility] [address], [place] (336) 832-1970   °[facility] [address], [place] (336) 832-4400   °[facility] ° [address], [place] (336) 992-4800   °[facility]/Dr. Osei-Bonsu ° [address] [place] [address], [place] or [address], [place] (336) [phone] Phone number for both [place] and [place] locations is the same.  °[facility] [address], [place] (336) 299-0000   °[facility] [address] [place] [address], [place] or [address] (336) 852-7530 °(336) 878-2260   °[facility] [address], [place] (336) 350-1642, phone; (336) 294-5005, fax Sees patients 1st and 3rd Saturday of every month.  Must not qualify for public or private insurance (i.e. Medicaid, Medicare, [place] Health Choice, Veterans' Benefits) • Household income should be no more than 200% of the poverty level •The clinic cannot treat you if you are pregnant or think you are pregnant • Sexually transmitted diseases are not treated at the clinic.  ° ° °Dental Care: °Organization         Address  Phone  Notes  °[place] [facility] [address], [place] (336) 641-6152 Accepts children up to age 21 who are enrolled in Medicaid or [place] Health Choice; pregnant women with a Medicaid card; and children who have applied for Medicaid or [place] Health Choice, but were declined, whose parents can pay a reduced fee at time of service.  °[place]  [facility] [place]  [address], [place] (336) 641-7733 Accepts children up to age 21 who are enrolled in Medicaid or [place] Health Choice; pregnant women with a Medicaid card; and children who have applied for Medicaid or [place] Health Choice, but were declined, whose parents can pay a reduced fee at time of service.  °[facility] ° [address], [place] (336) 641-4533 Patients are seen by appointment only. Walk-ins are not accepted. [facility] will see patients 18 years of age and older. °Monday - Tuesday (8am-5pm) °Most Wednesdays (8:30-5pm) °$30 per visit, cash only  °[facility] ° [address], [place] (336) 641-4533 Patients are seen by appointment only. Walk-ins are not accepted. [facility] will see patients 18 years of age and older. °One   Wednesday Evening (Monthly: Volunteer Based).  $30 per visit, cash only  °[facility]  (919) 537-3737 for adults; Children under age 4, call Graduate Pediatric Dentistry at ([phone]) [phone]. Children aged 4-14, please call (919) [phone] to request a pediatric application. ° Dental services are provided in all areas of dental care including fillings, crowns and bridges, complete and partial dentures, implants, gum treatment, root canals, and extractions. Preventive care is also provided. Treatment is provided to both adults and children. °Patients are selected via a lottery and there is often a waiting list. °  °[facility] [address], °[place] ° (336) 763-8833 [email or web address] °  °[facility] [address], [place], [place] (336)723-1848, Ext. 123 Second and Fourth Thursday of each month, opens at 6:30 AM; Clinic ends at 9 AM.  Patients are seen on a first-come first-served basis, and a limited number are seen during each clinic.  ° °[facility] ° [address], [place], [place] (336) [phone]    Eligibility Requirements °You must have lived in [place], [place], or [place] counties for at least the last three months. °  You cannot be eligible for state or federal sponsored healthcare insurance, including Veterans Administration, Medicaid, or Medicare. °  You generally cannot be eligible for healthcare insurance through your employer.  °  How to apply: °Eligibility screenings are held every Tuesday and Wednesday afternoon from 1:00 pm until 4:00 pm. You do not need an appointment for the interview!  °[facility] [address], [place], [place] 336-631-2330   °[facility]  336-342-8273   °[facility]  336-703-3100   °[facility]  336-570-6415   ° °Behavioral Health Resources in the Community: °Intensive Outpatient Programs °Organization         Address  Phone  Notes  °[facility] [address], [place], [place] 336-878-6098   °[facility] [address], [place], [place] 336-832-9800   °[facility] [address], [place], [place] ° 336-882-2125   °[facility] [address],  °[place], [place] 1-800-853-5163 or 336-641-4981   °Substance Abuse Resources °Organization         Address  Phone  Notes  °[facility]  336-882-2125   °[facility]  336-784-9470   °[facility]  336-285-9073   °[facility]  336-845-3988   °Residential & Outpatient Substance Abuse Program  1-800-659-3381   °Psychological Services °Organization         Address  Phone  Notes  °[facility]  336- 832-9600   °[facility]  336- 378-7881   °[facility] [address], [place] 1-800-853-5163 or 336-641-4981   ° °Mobile Crisis Teams °Organization         Address  Phone  Notes  °[facility], Mobile Crisis Care Unit  1-877-626-1772   °Assertive °[facility] ° [address]  [place], [place] 336-834-9664   °Sharon DeEsch [address] °[place] 336-554-5454   ° °Self-Help/Support Groups °Organization         Address  Phone             Notes  °[facility]  - variety of support groups  336- 373-1402 Call for more information  °Narcotics Anonymous (NA), [facility] [address], °[place]  2 meetings at this location  ° °  Residential Treatment Programs Organization         Address  Phone  Notes  [facility] [address],    [place]  1-324-401-0272   [facility]  [address], [place] [address], [place], [place] 034-742-5956   [facility] [address] [place], [place] 387-564-3329 Admissions: 8am-3pm M-F  [facility] [address],    [place], [place] 518-841-6606   [facility] [address] [place], [place], [place] 301-601-0932   [facility] [address]  [place], [place] 355-732-2025   [facility] Intensive Outpatient [address], [place], [place] 427-062-3762   [facility] ([facility] [address] [place]  [place], [place] 8-315-176-1607 or 431-166-2672   [facility] ([facility] [address] [place], [place] 546-270-3500 Accepts Medicaid  [facility] [address],  [place] 9-381-829-9371 Substance Abuse/Addiction Treatment   [facility] Organization         Address  Phone  Notes  [facility]  (240)794-4162   Angie Fava, PhD [address] [place], [place]   817-755-7746 or 505-761-4050   [facility]   [address] [place], [place] (603)763-4430   [facility] [address] [place], [place] (505) 301-1996 Insurance/Medicaid/sponsorship through [facility] and Families [address] [place] [address]                                    [place], [place] (385) 235-7109 Therapy/tele-psych/case    [facility] [address], [place] [phone]    Dr. Lolly Mustache  (575)718-5045   [facility]  [employer] [facility] Dept. 1) [address], [place] 2) [address], [place] 3)  [address], [place] [phone]  9738177506   [facility] Child Abuse Hotline 8487745298 or 701-113-2628 (After Hours)      Take your Trokendi ER prescription: 3 tablets by mouth daily. You have already received this dose while you were in the Emergency Department today. Call your regular Neurologist on Monday to schedule a follow up appointment within the next 3 days; he is expecting your call and to see you in his office next week.   Return to the Emergency Department immediately sooner if worsening.

## 2015-09-11 ENCOUNTER — Emergency Department (HOSPITAL_COMMUNITY)
Admission: EM | Admit: 2015-09-11 | Discharge: 2015-09-11 | Disposition: A | Discharge status: Home / Self Care | Payer: Medicaid Other | Attending: Emergency Medicine | Admitting: Emergency Medicine

## 2015-09-11 DIAGNOSIS — Z881 Allergy status to other antibiotic agents status: Secondary | ICD-10-CM | POA: Insufficient documentation

## 2015-09-11 DIAGNOSIS — G8929 Other chronic pain: Secondary | ICD-10-CM | POA: Diagnosis not present

## 2015-09-11 DIAGNOSIS — Z79899 Other long term (current) drug therapy: Secondary | ICD-10-CM | POA: Insufficient documentation

## 2015-09-11 DIAGNOSIS — R569 Unspecified convulsions: Secondary | ICD-10-CM | POA: Diagnosis present

## 2015-09-11 DIAGNOSIS — Z9851 Tubal ligation status: Secondary | ICD-10-CM | POA: Insufficient documentation

## 2015-09-11 DIAGNOSIS — G40909 Epilepsy, unspecified, not intractable, without status epilepticus: Secondary | ICD-10-CM | POA: Diagnosis not present

## 2015-09-11 DIAGNOSIS — R51 Headache: Secondary | ICD-10-CM | POA: Insufficient documentation

## 2015-09-11 DIAGNOSIS — Z87891 Personal history of nicotine dependence: Secondary | ICD-10-CM | POA: Insufficient documentation

## 2015-09-11 DIAGNOSIS — F1010 Alcohol abuse, uncomplicated: Secondary | ICD-10-CM | POA: Insufficient documentation

## 2015-09-11 MED ORDER — LORAZEPAM 2 MG/ML IJ SOLN
1.0000 mg | Freq: Once | INTRAMUSCULAR | Status: AC
  Administered 2015-09-11: 1 mg via INTRAVENOUS
  Filled 2015-09-11: qty 1

## 2015-09-11 MED ORDER — ACETAMINOPHEN 325 MG PO TABS
650.0000 mg | ORAL_TABLET | Freq: Once | ORAL | Status: AC
  Administered 2015-09-11: 650 mg via ORAL
  Filled 2015-09-11: qty 2

## 2015-09-11 NOTE — ED Provider Notes (Signed)
CSN: [id number]     Arrival date & time 09/11/15  0152 History   First MD Initiated Contact with Patient 09/11/15 [phone]     Chief Complaint  Patient presents with  . Seizures     (Consider location/radiation/quality/duration/timing/severity/associated sxs/prior Treatment) HPI patient has a history of seizure disorder. Mother states she normally has them during her sleep. She was seen in the ED earlier this evening and left about 4:30 this afternoon after having had a seizure. Mother states they were sleeping about 1:15 and patient opened her eyes and look to her hand and then had generalized jerking for 3-4 minutes followed by postictal state. She was still postictal on EMS arrival. Mother states patient has been on Dilantin however she had 2 seizures in November and when they saw Dr. Gerilyn Pilgrim in December he had her stop the Dilantin, however she did not stop it and continued it until 2 weeks ago because she was afraid to stop. He started her on Trohendi XR. He was called with her ED visit earlier this evening and told her to increase it to 3 tablets once a day. She took 3 tablets in the emergency department. Patient only complains of a headache now.  PCP Dr Tanya Nones Neurology Dr Gerilyn Pilgrim  Past Medical History  Diagnosis Date  . Seizures (HCC)   . Anxiety   . Epilepsy (HCC)   . Nocturnal seizures (HCC) 06/23/2014  . Obesity   . Endometriosis   . Chronic headache    Past Surgical History  Procedure Laterality Date  . Cesarean section    . Colon surgery    . Tubal ligation Bilateral    Family History  Problem Relation Age of Onset  . Seizures Mother   . Diabetes Mother   . Heart failure Mother   . Cancer Father   . Seizures Maternal Uncle    Social History  Substance Use Topics  . Smoking status: Former Smoker -- 0.25 packs/day for 5 years    Types: Cigarettes  . Smokeless tobacco: Never Used  . Alcohol Use: Yes     Comment: occ   Lives with mother  OB History    Gravida  Para Term Preterm AB TAB SAB Ectopic Multiple Living   Review of Systems  All other systems reviewed and are negative.     Allergies  Azithromycin; Bactrim; Cephalexin; Keppra; Penicillins; Ultram; and Vimpat  Home Medications   Prior to Admission medications   Medication Sig Start Date End Date Taking? Authorizing Provider  phenytoin (DILANTIN) 100 MG ER capsule Take 2 capsules (200 mg total) by mouth 2 (two) times daily. Patient not taking: Reported on 09/10/2015 06/23/14   York Spaniel, MD  Topiramate ER (TROKENDI XR) 50 MG CP24 Take 2 tablets by mouth daily. 06/17/15   Beryle Beams, MD   BP 102/73 mmHg  Pulse 67  Temp(Src) 98.8 F (37.1 C) (Oral)  Resp 16  Ht  (1.6 m)  Wt 226 lb (102.513 kg)  BMI 40.04 kg/m2  SpO2 97%  Vital signs normal   Physical Exam  Constitutional: She is oriented to person, place, and time. She appears well-developed and well-nourished.  Non-toxic appearance. She does not appear ill. No distress.  HENT:  Head: Normocephalic and atraumatic.  Right Ear: External ear normal.  Left Ear: External ear normal.  Nose: Nose normal. No mucosal edema or rhinorrhea.  Mouth/Throat: Oropharynx is clear and  moist and mucous membranes are normal. No dental abscesses or uvula swelling.  Patient has bruising on both sides of her tongue.  Eyes: Conjunctivae and EOM are normal. Pupils are equal, round, and reactive to light.  Neck: Normal range of motion and full passive range of motion without pain. Neck supple.  Cardiovascular: Normal rate, regular rhythm and normal heart sounds.  Exam reveals no gallop and no friction rub.   No murmur heard. Pulmonary/Chest: Effort normal and breath sounds normal. No respiratory distress. She has no wheezes. She has no rhonchi. She has no rales. She exhibits no tenderness and no crepitus.  Abdominal: Soft. Normal appearance and bowel sounds are normal. She exhibits no distension. There is no  tenderness. There is no rebound and no guarding.  Musculoskeletal: Normal range of motion. She exhibits no edema or tenderness.  Moves all extremities well.   Neurological: She is alert and oriented to person, place, and time. She has normal strength. No cranial nerve deficit.  Skin: Skin is warm, dry and intact. No rash noted. No erythema. No pallor.  Psychiatric: She has a normal mood and affect. Her speech is normal and behavior is normal. Her mood appears not anxious.  Nursing note and vitals reviewed.   ED Course  Procedures (including critical care time)  Medications  acetaminophen (TYLENOL) tablet 650 mg (650 mg Oral Given 09/11/15 0354)  LORazepam (ATIVAN) injection 1 mg (1 mg Intravenous Given 09/11/15 0455)    Patient was given acetaminophen for headache and she was given Ativan 1 mg IV because of her seizure. She had no further seizure activity while in the ED. I've instructed her mother to call Dr. Ronal Fear office on Monday to let him know she had 2 ED visits today for seizure activity. They are unhappy and wanted her to remain on the Dilantin.  MDM   Final diagnoses:  Seizure disorder [facility])    Plan discharge  Devoria Albe, MD, Concha Pyo, MD 09/11/15 [phone]

## 2015-09-11 NOTE — Discharge Instructions (Signed)
Follow up with Dr Ronal Fear office on Monday. Let them know you  had 2 seizures tonight. Continue taking all your medication. NO DRIVING, CLIMBING LADDERS OR DOING ANY ACTIVITY THAT YOU COULD GET HURT IF YOU HAVE ANOTHER SEIZURE.

## 2015-09-11 NOTE — ED Notes (Signed)
Pt was seen here earlier on Friday evening and discharged.  Pt had another seizure tonight and pt was post-ictal on ems arrival.  Pt is awake and alert on arrival to the e.d.

## 2015-09-13 LAB — TOPIRAMATE LEVEL: Topiramate Lvl: NOT DETECTED ug/mL (ref 2.0–25.0)

## 2015-11-04 ENCOUNTER — Other Ambulatory Visit (HOSPITAL_COMMUNITY): Payer: Self-pay | Admitting: Respiratory Therapy

## 2015-11-04 DIAGNOSIS — G4730 Sleep apnea, unspecified: Secondary | ICD-10-CM

## 2015-11-14 ENCOUNTER — Ambulatory Visit: Payer: Medicaid Other | Attending: Pulmonary Disease | Admitting: Neurology

## 2015-11-14 DIAGNOSIS — G4730 Sleep apnea, unspecified: Secondary | ICD-10-CM | POA: Insufficient documentation

## 2015-11-25 NOTE — Procedures (Signed)
  [facility] Trenita Hulme A. Gerilyn Pilgrimoonquah, MD     [email or web address]             NOCTURNAL POLYSOMNOGRAPHY   LOCATION: [facility]   Demographics Edit Patient Name: Whitney Scott, Whitney Scott Study Date: 11/14/2015 Gender: Female D.O.B: 18-Nov-1985 Age (years): 89 Referring Provider: Not Available Height (inches): 63 Interpreting Physician: Beryle BeamsKofi Trenita Hulme MD, ABSM Weight (lbs): 246 RPSGT: Alfonso EllisHedrick, Debra BMI: 44 MRN: [id number] Neck Size: 15.00   CLINICAL INFORMATION EditMoveRemove this section Sleep Study Type: NPSG Indication for sleep study: N/A Epworth Sleepiness Score: 10 SLEEP STUDY TECHNIQUE EditMoveRemove this section As per the AASM Manual for the Scoring of Sleep and Associated Events v2.3 (April 2016) with a hypopnea requiring 4% desaturations. The channels recorded and monitored were frontal, central and occipital EEG, electrooculogram (EOG), submentalis EMG (chin), nasal and oral airflow, thoracic and abdominal wall motion, anterior tibialis EMG, snore microphone, electrocardiogram, and pulse oximetry. MEDICATIONS   Current outpatient prescriptions:  .  phenytoin (DILANTIN) 100 MG ER capsule, Take 2 capsules (200 mg total) by mouth 2 (two) times daily. (Patient not taking: Reported on 09/10/2015), Disp: 120 capsule, Rfl: 3 .  Topiramate ER (TROKENDI XR) 50 MG CP24, Take 2 tablets by mouth daily., Disp: , Rfl:  .  [DISCONTINUED] DULoxetine (CYMBALTA) 30 MG capsule, Take 30 mg by mouth daily., Disp: , Rfl:   Patient's medications include: N/A. Medications self-administered by patient during sleep study : No sleep medicine administered. SLEEP ARCHITECTURE EditMoveRemove this section The study was initiated at 10:48:19 PM and ended at 5:13:47 AM. Sleep onset time was 4.6 minutes and the sleep efficiency was 95.2%. The total sleep time was 366.9 minutes. Stage REM latency was 55.0 minutes. The patient spent 1.50% of the night in stage N1 sleep, 61.02% in stage N2 sleep, 14.17% in stage  N3 and 23.31% in REM. Alpha intrusion was absent. Supine sleep was 50.60%. RESPIRATORY PARAMETERS EditMoveRemove this section The overall apnea/hypopnea index (AHI) was 3.9 per hour. There were 9 total apneas, including 8 obstructive, 0 central and 1 mixed apneas. There were 15 hypopneas and 0 RERAs. The AHI during Stage REM sleep was 16.8 per hour. AHI while supine was 7.4 per hour. The mean oxygen saturation was 94.56%. The minimum SpO2 during sleep was 86.00%. Moderate snoring was noted during this study. CARDIAC DATA EditMoveRemove this section The 2 lead EKG demonstrated sinus rhythm. The mean heart rate was N/A beats per minute. Other EKG findings include: None. LEG MOVEMENT DATA EditMoveRemove this section The total PLMS were 0 with a resulting PLMS index of 0.00. Associated arousal with leg movement index was 0.0.    IMPRESSIONS  No significant obstructive sleep apnea occurred during this study. No significant central sleep apnea occurred during this study.   Argie RammingKofi A Marina Desire, MD Diplomate, American Board of Sleep Medicine.

## 2017-04-18 ENCOUNTER — Emergency Department (HOSPITAL_COMMUNITY): Payer: Medicaid Other

## 2017-04-18 ENCOUNTER — Encounter (HOSPITAL_COMMUNITY): Payer: Self-pay | Admitting: Emergency Medicine

## 2017-04-18 ENCOUNTER — Emergency Department (HOSPITAL_COMMUNITY)
Admission: EM | Admit: 2017-04-18 | Discharge: 2017-04-18 | Disposition: A | Discharge status: Home / Self Care | Payer: Medicaid Other | Attending: Emergency Medicine | Admitting: Emergency Medicine

## 2017-04-18 DIAGNOSIS — R110 Nausea: Secondary | ICD-10-CM | POA: Insufficient documentation

## 2017-04-18 DIAGNOSIS — R0789 Other chest pain: Secondary | ICD-10-CM | POA: Insufficient documentation

## 2017-04-18 DIAGNOSIS — Z880 Allergy status to penicillin: Secondary | ICD-10-CM | POA: Diagnosis not present

## 2017-04-18 DIAGNOSIS — J40 Bronchitis, not specified as acute or chronic: Secondary | ICD-10-CM | POA: Diagnosis not present

## 2017-04-18 DIAGNOSIS — Z87891 Personal history of nicotine dependence: Secondary | ICD-10-CM | POA: Insufficient documentation

## 2017-04-18 DIAGNOSIS — Z79899 Other long term (current) drug therapy: Secondary | ICD-10-CM | POA: Diagnosis not present

## 2017-04-18 DIAGNOSIS — R0981 Nasal congestion: Secondary | ICD-10-CM | POA: Diagnosis not present

## 2017-04-18 DIAGNOSIS — R0602 Shortness of breath: Secondary | ICD-10-CM | POA: Diagnosis present

## 2017-04-18 MED ORDER — ONDANSETRON 8 MG PO TBDP: 8.0000 mg | ORAL_TABLET | Freq: Once | ORAL | Status: DC

## 2017-04-18 MED ORDER — METOCLOPRAMIDE HCL 10 MG PO TABS: 10.0000 mg | ORAL_TABLET | Freq: Four times a day (QID) | ORAL | 0 refills | Status: DC | PRN

## 2017-04-18 MED ORDER — ALBUTEROL SULFATE (2.5 MG/3ML) 0.083% IN NEBU
2.5000 mg | INHALATION_SOLUTION | Freq: Once | RESPIRATORY_TRACT | Status: AC
  Administered 2017-04-18: 2.5 mg via RESPIRATORY_TRACT
  Filled 2017-04-18: qty 3

## 2017-04-18 MED ORDER — ALBUTEROL SULFATE HFA 108 (90 BASE) MCG/ACT IN AERS
2.0000 | INHALATION_SPRAY | RESPIRATORY_TRACT | Status: DC | PRN
  Administered 2017-04-18: 2 via RESPIRATORY_TRACT
  Filled 2017-04-18: qty 6.7

## 2017-04-18 MED ORDER — AEROCHAMBER PLUS W/MASK MISC
1.0000 | Freq: Once | Status: AC
  Administered 2017-04-18: 1
  Filled 2017-04-18: qty 1

## 2017-04-18 MED ORDER — METOCLOPRAMIDE HCL 10 MG PO TABS
10.0000 mg | ORAL_TABLET | Freq: Once | ORAL | Status: AC
  Administered 2017-04-18: 10 mg via ORAL
  Filled 2017-04-18: qty 1

## 2017-04-18 NOTE — ED Notes (Signed)
Walked pt around the dept with pulse ox she stayed at 97% on room air until the end and dropped to 92%. Her heart rate stayed at 97-98 and after the walk her resp was 33. edp aware and orders received.

## 2017-04-18 NOTE — Discharge Instructions (Signed)
Use your albuterol inhaler 2 puffs every 4 hours as needed for cough or shortness of breath. Return if needed more than every 4 hours. Keep your scheduled appointment with [facility]  if breathing is not back to normal on Monday, October 8,2018. Return if concern for any reason

## 2017-04-18 NOTE — ED Triage Notes (Signed)
PT c/o dry/painful/non-productive expiratory wheezing with body aches x2 days.

## 2017-04-18 NOTE — ED Provider Notes (Signed)
[facility]-EMERGENCY DEPT Provider Note   CSN: [id number] Arrival date & time: 04/18/17  1720     History   Chief Complaint Chief Complaint  Patient presents with  . Shortness of Breath    HPI Whitney WINWARD is a 31 y.o. female.  HPI  Complains of nonproductive cough for the past 2 days accompanied by congestion in her sinuses. Unknown if she's had a fever. Treated with Mucinex without relief. Other associated symptoms include chest pain worse with coughing, nausea and shortness of breath. Chest pain improved with remaining still. Past Medical History:  Diagnosis Date  . Anxiety   . Chronic headache   . Endometriosis   . Epilepsy (HCC)   . Nocturnal seizures (HCC) 06/23/2014  . Obesity   . Seizures [facility])     Patient Active Problem List   Diagnosis Date Noted  . Nocturnal seizures (HCC) 06/23/2014  . Head ache 06/23/2014    Past Surgical History:  Procedure Laterality Date  . CESAREAN SECTION    . COLON SURGERY    . TUBAL LIGATION Bilateral     OB History    Gravida Para Term Preterm AB Living   SAB TAB Ectopic Multiple Live Births                   Home Medications    Prior to Admission medications   Medication Sig Start Date End Date Taking? Authorizing Provider  phenytoin (DILANTIN) 100 MG ER capsule Take 2 capsules (200 mg total) by mouth 2 (two) times daily. Patient not taking: Reported on 09/10/2015 06/23/14   York Spaniel, MD  Topiramate ER (TROKENDI XR) 50 MG CP24 Take 2 tablets by mouth daily. 06/17/15   Beryle Beams, MD    Family History Family History  Problem Relation Age of Onset  . Seizures Mother   . Diabetes Mother   . Heart failure Mother   . Cancer Father   . Seizures Maternal Uncle     Social History Social History  Substance Use Topics  . Smoking status: Former Smoker    Packs/day: 0.25    Years: 5.00    Types: Cigarettes  . Smokeless tobacco: Never Used  . Alcohol use Yes     Comment: occ      Allergies   Azithromycin; Bactrim; Cephalexin; Keppra [levetiracetam]; Penicillins; Ultram [tramadol]; and Vimpat [lacosamide]   Review of Systems Review of Systems  Constitutional: Negative.   HENT: Positive for congestion.   Respiratory: Positive for cough and shortness of breath.   Cardiovascular: Positive for chest pain.  Gastrointestinal: Positive for nausea.  Musculoskeletal: Negative.   Skin: Negative.   Neurological: Negative.   Psychiatric/Behavioral: Negative.   All other systems reviewed and are negative.    Physical Exam Updated Vital Signs BP 120/71   Pulse 77   Temp 98.2 F (36.8 C) (Oral)   Resp 20   Ht  (1.6 m)   Wt 83 kg (183 lb)   LMP 04/11/2017   SpO2 94%   BMI 32.42 kg/m   Physical Exam  Constitutional: She appears well-developed and well-nourished. No distress.  HENT:  Head: Normocephalic and atraumatic.  Mouth/Throat: Oropharynx is clear and moist.  Eyes: Pupils are equal, round, and reactive to light. Conjunctivae are normal.  Neck: Neck supple. No tracheal deviation present. No thyromegaly present.  Cardiovascular: Normal rate and regular rhythm.   No murmur heard. Pulmonary/Chest: Effort normal. She has  wheezes.  Expiratory wheezes. No respiratory distress. Coughing occasionally  Abdominal: Soft. Bowel sounds are normal. She exhibits no distension. There is no tenderness.  Musculoskeletal: Normal range of motion. She exhibits no edema or tenderness.  Neurological: She is alert. Coordination normal.  Skin: Skin is warm and dry. No rash noted.  Psychiatric: She has a normal mood and affect.  Nursing note and vitals reviewed.    ED Treatments / Results  Labs (all labs ordered are listed, but only abnormal results are displayed) Labs Reviewed - No data to display  EKG  EKG Interpretation None       Radiology No results found.  Procedures Procedures (including critical care time)  Medications Ordered in  ED Medications  albuterol (PROVENTIL) (2.5 MG/3ML) 0.083% nebulizer solution 2.5 mg (not administered)  metoCLOPramide (REGLAN) tablet 10 mg (10 mg Oral Given 04/18/17 1809)   Chest x-ray viewed by me Results for orders placed or performed during the hospital encounter of 09/10/15  Phenytoin level, total  Result Value Ref Range   Phenytoin Lvl <2.5 (L) 10.0 - 20.0 ug/mL  Topiramate level  Result Value Ref Range   Topiramate Lvl None Detected 2.0 - 25.0 ug/mL   Dg Chest 2 View  Result Date: 04/18/2017 CLINICAL DATA:  Shortness of breath EXAM: CHEST  2 VIEW COMPARISON:  10/05/2012 FINDINGS: The heart size and mediastinal contours are within normal limits. Both lungs are clear. The visualized skeletal structures are unremarkable. IMPRESSION: No active cardiopulmonary disease. Electronically Signed   By: Jasmine Pang M.D.   On: 04/18/2017 19:00   Results for orders placed or performed during the hospital encounter of 09/10/15  Phenytoin level, total  Result Value Ref Range   Phenytoin Lvl <2.5 (L) 10.0 - 20.0 ug/mL  Topiramate level  Result Value Ref Range   Topiramate Lvl None Detected 2.0 - 25.0 ug/mL   Dg Chest 2 View  Result Date: 04/18/2017 CLINICAL DATA:  Shortness of breath EXAM: CHEST  2 VIEW COMPARISON:  10/05/2012 FINDINGS: The heart size and mediastinal contours are within normal limits. Both lungs are clear. The visualized skeletal structures are unremarkable. IMPRESSION: No active cardiopulmonary disease. Electronically Signed   By: Jasmine Pang M.D.   On: 04/18/2017 19:00    Initial Impression / Assessment and Plan / ED Course  I have reviewed the triage vital signs and the nursing notes.  Pertinent labs & imaging results that were available during my care of the patient were reviewed by me and considered in my medical decision making (see chart for details).   7:20 PM patient states breathing improved after albuterol nebulized treatment. Upon getting up to ambulate she  became mildly short of breath. Oxygen saturation dropped to 92%. A second nebulas treatment was ordered and nausea has resolved after treatment with oral Reglan  8:30 PM patient's breathing is improved after second nebulized albuterol treatment. She feels ready to go home. On repeat lung exam she has mild scant diffuse rhonchi. She speaks in paragraphs. No respiratory distress. She was able to walk around the ED with mild dyspnea and without pulse ox desaturating. Plan albuterol HFA with spacer to go to use 2 puffs every 4 hours as needed for cough or shortness of breath. Prescription Reglan as needed for nausea. She is encouraged to keep her scheduled plan with [facility] in 5 days if not improving or return to the emergency department if albuterol HFA inhaler here more than every 4 hours Final Clinical Impressions(s) /  ED Diagnoses  Diagnosis acute bronchitis Final diagnoses:  None    New Prescriptions New Prescriptions   No medications on file     Doug Sou, MD 04/18/17 2134

## 2017-04-18 NOTE — ED Notes (Signed)
Pt ambulatory around nursing desk, pulse ox remained mostly at 96-98% on RA, pulse ox did drop to 93% on RA for a brief moment, pt reports that she if feeling better, ready to go home,

## 2017-04-18 NOTE — ED Notes (Signed)
resp paged for breathing treatment

## 2017-07-02 ENCOUNTER — Encounter (HOSPITAL_COMMUNITY): Payer: Self-pay | Admitting: Emergency Medicine

## 2017-07-02 ENCOUNTER — Emergency Department (HOSPITAL_COMMUNITY)
Admission: EM | Admit: 2017-07-02 | Discharge: 2017-07-03 | Disposition: A | Discharge status: Home / Self Care | Payer: Medicaid Other | Attending: Emergency Medicine | Admitting: Emergency Medicine

## 2017-07-02 ENCOUNTER — Other Ambulatory Visit: Payer: Self-pay

## 2017-07-02 DIAGNOSIS — Z79899 Other long term (current) drug therapy: Secondary | ICD-10-CM | POA: Diagnosis not present

## 2017-07-02 DIAGNOSIS — F17210 Nicotine dependence, cigarettes, uncomplicated: Secondary | ICD-10-CM | POA: Insufficient documentation

## 2017-07-02 DIAGNOSIS — R569 Unspecified convulsions: Secondary | ICD-10-CM

## 2017-07-02 LAB — COMPREHENSIVE METABOLIC PANEL
ALBUMIN: 3.8 g/dL (ref 3.5–5.0)
ALK PHOS: 78 U/L (ref 38–126)
ALT: 17 U/L (ref 14–54)
AST: 20 U/L (ref 15–41)
Anion gap: 7 (ref 5–15)
BILIRUBIN TOTAL: 0.2 mg/dL — AB (ref 0.3–1.2)
BUN: 6 mg/dL (ref 6–20)
CO2: 22 mmol/L (ref 22–32)
CREATININE: 0.61 mg/dL (ref 0.44–1.00)
Calcium: 8.9 mg/dL (ref 8.9–10.3)
Chloride: 106 mmol/L (ref 101–111)
GFR calc Af Amer: >60 mL/min (ref >60)
GLUCOSE: 101 mg/dL — AB (ref 65–99)
POTASSIUM: 3.8 mmol/L (ref 3.5–5.1)
Sodium: 135 mmol/L (ref 135–145)
TOTAL PROTEIN: 7 g/dL (ref 6.5–8.1)

## 2017-07-02 LAB — CBC WITH DIFFERENTIAL/PLATELET
BASOS ABS: 0 10*3/uL (ref 0.0–0.1)
Basophils Relative: 0 %
EOS ABS: 0.1 10*3/uL (ref 0.0–0.7)
EOS PCT: 1 %
HCT: 43.5 % (ref 36.0–46.0)
Hemoglobin: 14.8 g/dL (ref 12.0–15.0)
LYMPHS ABS: 2.2 10*3/uL (ref 0.7–4.0)
LYMPHS PCT: 27 %
MCH: 31.6 pg (ref 26.0–34.0)
MCHC: 34 g/dL (ref 30.0–36.0)
MCV: 92.9 fL (ref 78.0–100.0)
MONO ABS: 0.5 10*3/uL (ref 0.1–1.0)
Monocytes Relative: 6 %
Neutro Abs: 5.4 10*3/uL (ref 1.7–7.7)
Neutrophils Relative %: 66 %
PLATELETS: 376 10*3/uL (ref 150–400)
RBC: 4.68 MIL/uL (ref 3.87–5.11)
RDW: 13.2 % (ref 11.5–15.5)
WBC: 8.2 10*3/uL (ref 4.0–10.5)

## 2017-07-02 LAB — I-STAT BETA HCG BLOOD, ED (MC, WL, AP ONLY)

## 2017-07-02 MED ORDER — ACETAMINOPHEN 500 MG PO TABS
1000.0000 mg | ORAL_TABLET | Freq: Once | ORAL | Status: AC
  Administered 2017-07-02: 1000 mg via ORAL
  Filled 2017-07-02: qty 2

## 2017-07-02 MED ORDER — TOPIRAMATE ER 50 MG PO CAP24: 2.0000 | ORAL_CAPSULE | Freq: Every day | ORAL | 0 refills | Status: AC

## 2017-07-02 NOTE — Discharge Instructions (Signed)

## 2017-07-02 NOTE — ED Triage Notes (Signed)
Patient brought in by EMS, states mother found patient having a seizure today. Per EMS, patient was postictal upon arrival and incontinent of urine. States she has been out of topamax x 2 months. States this is first seizure in 2 years. Patient states she has appt with neurologist tomorrow. Patient bit tongue during seizure, bleeding controlled at this time. Patient alert and oriented at triage.

## 2017-07-02 NOTE — ED Provider Notes (Signed)
Emergency Department Provider Note   I have reviewed the triage vital signs and the nursing notes.   HISTORY  Chief Complaint Seizures   HPI Whitney Scott is a 31 y.o. female with PMH of epilepsy presents to the emergency department for evaluation after breakthrough seizure.  She has been out of her Topirimate for the last month.  Mom states that the patient was sleeping when she heard her having a seizure from the other room.  She went in the room to find her having a seizure with frothing at the mouth.  Patient had some bleeding coming from her tongue and had urinated on herself.  EMS was called.  Patient has follow-up appointment tomorrow with her neurologist but has been off of her medication for the last month.  Patient is now awake and alert and states she is feeling okay except for headache.    Past Medical History:  Diagnosis Date  . Anxiety   . Chronic headache   . Endometriosis   . Epilepsy (HCC)   . Nocturnal seizures (HCC) 06/23/2014  . Obesity   . Seizures [facility](HCC)     Patient Active Problem List   Diagnosis Date Noted  . Nocturnal seizures (HCC) 06/23/2014  . Head ache 06/23/2014    Past Surgical History:  Procedure Laterality Date  . CESAREAN SECTION    . COLON SURGERY    . TUBAL LIGATION Bilateral     Current Outpatient Rx  . Order #: [id number] Class: Print  . Order #: [id number] Class: Print    Allergies Azithromycin; Bactrim; Cephalexin; Keppra [levetiracetam]; Penicillins; Ultram [tramadol]; and Vimpat [lacosamide]  Family History  Problem Relation Age of Onset  . Seizures Mother   . Diabetes Mother   . Heart failure Mother   . Cancer Father   . Seizures Maternal Uncle     Social History Social History   Tobacco Use  . Smoking status: Current Some Day Smoker    Packs/day: 0.25    Years: 5.00    Pack years: 1.25    Types: Cigarettes  . Smokeless tobacco: Never Used  Substance Use Topics  . Alcohol use: Yes    Comment: occ  . Drug  use: No    Review of Systems  Constitutional: No fever/chills Eyes: No visual changes. ENT: No sore throat. Positive tongue pain/bleeding.  Cardiovascular: Denies chest pain. Respiratory: Denies shortness of breath. Gastrointestinal: No abdominal pain.  No nausea, no vomiting.  No diarrhea.  No constipation. Genitourinary: Negative for dysuria. Musculoskeletal: Negative for back pain. Skin: Negative for rash. Neurological: Negative for headaches, focal weakness or numbness. Positive breakthrough seizure.   10-point ROS otherwise negative.  ____________________________________________   PHYSICAL EXAM:  VITAL SIGNS: ED Triage Vitals  Enc Vitals Group     BP 07/02/17 1258 114/77     Pulse Rate 07/02/17 1258 90     Resp 07/02/17 1258 20     Temp 07/02/17 1258 98.2 F (36.8 C)     Temp Source 07/02/17 1258 Oral     SpO2 07/02/17 1258 97 %     Weight 07/02/17 1259 185 lb (83.9 kg)     Height 07/02/17 1259 5\' 3"  (1.6 m)     Pain Score 07/02/17 1258 8   Constitutional: Alert and oriented. Well appearing and in no acute distress. Eyes: Conjunctivae are normal.  Head: Atraumatic. Nose: No congestion/rhinnorhea. Mouth/Throat: Mucous membranes are moist.  Oropharynx non-erythematous. Abrasions to the tip and lateral tongue.  Neck: No stridor. Cardiovascular:  Normal rate, regular rhythm. Good peripheral circulation. Grossly normal heart sounds.   Respiratory: Normal respiratory effort.  No retractions. Lungs CTAB. Gastrointestinal: Soft and nontender. No distention.  Musculoskeletal: No lower extremity tenderness nor edema. No gross deformities of extremities. Neurologic:  Normal speech and language. No gross focal neurologic deficits are appreciated. Ambulatory without difficulty.  Skin:  Skin is warm, dry and intact. No rash noted.  ____________________________________________   LABS (all labs ordered are listed, but only abnormal results are displayed)  Labs Reviewed    COMPREHENSIVE METABOLIC PANEL - Abnormal; Notable for the following components:      Result Value   Glucose, Bld 101 (*)    Total Bilirubin 0.2 (*)    All other components within normal limits  CBC WITH DIFFERENTIAL/PLATELET  I-STAT BETA HCG BLOOD, ED ([facility], [facility], [facility] ONLY)   ____________________________________________  EKG   EKG Interpretation  Date/Time:  Monday July 02 2017 14:02:43 EST Ventricular Rate:  72 PR Interval:    QRS Duration: 82 QT Interval:  393 QTC Calculation: 431 R Axis:   46 Text Interpretation:  Sinus rhythm Low voltage, precordial leads Baseline wander in lead(s) V1 No STEMI.  Confirmed by Alona BeneLong, Jacolyn Joaquin [phone]) on 07/02/2017 2:10:29 PM       ____________________________________________  RADIOLOGY  None ____________________________________________   PROCEDURES  Procedure(s) performed:   Procedures  None ____________________________________________   INITIAL IMPRESSION / ASSESSMENT AND PLAN / ED COURSE  Pertinent labs & imaging results that were available during my care of the patient were reviewed by me and considered in my medical decision making (see chart for details).  Patient presents to the emergency department for evaluation after breakthrough seizure.  She is been off of her seizure medication for the last month.  Plan for screening labs, pregnancy, discussion with the patient's local neurologist.  She is scheduled to see him tomorrow.   02:02 PM Spoke with Dr. Gerilyn Pilgrimoonquah who advises restarting seizure meds at her normal dose. Will see patient as scheduled in the office tomorrow.   At this time, I do not feel there is any life-threatening condition present. I have reviewed and discussed all results (EKG, imaging, lab, urine as appropriate), exam findings with patient. I have reviewed nursing notes and appropriate previous records.  I feel the patient is safe to be discharged home without further emergent workup. Discussed usual and  customary return precautions. Patient and family (if present) verbalize understanding and are comfortable with this plan.  Patient will follow-up with their primary care provider. If they do not have a primary care provider, information for follow-up has been provided to them. All questions have been answered. d ____________________________________________  FINAL CLINICAL IMPRESSION(S) / ED DIAGNOSES  Final diagnoses:  Seizure-like activity (HCC)     MEDICATIONS GIVEN DURING THIS VISIT:  Medications  acetaminophen (TYLENOL) tablet 1,000 mg (1,000 mg Oral Given 07/02/17 1504)     NEW OUTPATIENT MEDICATIONS STARTED DURING THIS VISIT:  Refilled Topiramate   Note:  This document was prepared using Dragon voice recognition software and may include unintentional dictation errors.  Alona BeneJoshua Armani Brar, MD Emergency Medicine    Levi Klaiber, Arlyss RepressJoshua G, MD 07/02/17 [phone]

## 2017-07-02 NOTE — ED Notes (Signed)
Pt given scrub pants and mesh underwear due to urination during seizure.

## 2017-07-30 ENCOUNTER — Other Ambulatory Visit: Payer: Self-pay

## 2017-07-30 ENCOUNTER — Encounter (HOSPITAL_COMMUNITY): Payer: Self-pay | Admitting: Emergency Medicine

## 2017-07-30 ENCOUNTER — Emergency Department (HOSPITAL_COMMUNITY)
Admission: EM | Admit: 2017-07-30 | Discharge: 2017-07-30 | Disposition: A | Discharge status: Home / Self Care | Payer: Medicaid Other | Attending: Emergency Medicine | Admitting: Emergency Medicine

## 2017-07-30 DIAGNOSIS — G40909 Epilepsy, unspecified, not intractable, without status epilepticus: Secondary | ICD-10-CM | POA: Insufficient documentation

## 2017-07-30 DIAGNOSIS — Z79899 Other long term (current) drug therapy: Secondary | ICD-10-CM | POA: Diagnosis not present

## 2017-07-30 DIAGNOSIS — F17210 Nicotine dependence, cigarettes, uncomplicated: Secondary | ICD-10-CM | POA: Diagnosis not present

## 2017-07-30 DIAGNOSIS — R569 Unspecified convulsions: Secondary | ICD-10-CM | POA: Diagnosis present

## 2017-07-30 LAB — BASIC METABOLIC PANEL
Anion gap: 10 (ref 5–15)
BUN: 11 mg/dL (ref 6–20)
CALCIUM: 9 mg/dL (ref 8.9–10.3)
CO2: 20 mmol/L — AB (ref 22–32)
Chloride: 108 mmol/L (ref 101–111)
Creatinine, Ser: 0.7 mg/dL (ref 0.44–1.00)
GFR calc Af Amer: >60 mL/min (ref >60)
GLUCOSE: 100 mg/dL — AB (ref 65–99)
Potassium: 3.6 mmol/L (ref 3.5–5.1)
Sodium: 138 mmol/L (ref 135–145)

## 2017-07-30 LAB — CBG MONITORING, ED: GLUCOSE-CAPILLARY: 110 mg/dL — AB (ref 65–99)

## 2017-07-30 LAB — CBC WITH DIFFERENTIAL/PLATELET
BASOS ABS: 0 10*3/uL (ref 0.0–0.1)
Basophils Relative: 0 %
EOS PCT: 1 %
Eosinophils Absolute: 0.1 10*3/uL (ref 0.0–0.7)
HEMATOCRIT: 41.9 % (ref 36.0–46.0)
Hemoglobin: 14.3 g/dL (ref 12.0–15.0)
LYMPHS PCT: 20 %
Lymphs Abs: 2.1 10*3/uL (ref 0.7–4.0)
MCH: 31.6 pg (ref 26.0–34.0)
MCHC: 34.1 g/dL (ref 30.0–36.0)
MCV: 92.7 fL (ref 78.0–100.0)
MONO ABS: 0.5 10*3/uL (ref 0.1–1.0)
MONOS PCT: 5 %
NEUTROS ABS: 7.7 10*3/uL (ref 1.7–7.7)
Neutrophils Relative %: 74 %
PLATELETS: 363 10*3/uL (ref 150–400)
RBC: 4.52 MIL/uL (ref 3.87–5.11)
RDW: 13 % (ref 11.5–15.5)
WBC: 10.3 10*3/uL (ref 4.0–10.5)

## 2017-07-30 LAB — I-STAT BETA HCG BLOOD, ED (MC, WL, AP ONLY): I-stat hCG, quantitative: <5 m[IU]/mL (ref <5)

## 2017-07-30 LAB — ETHANOL

## 2017-07-30 MED ORDER — PHENYTOIN SODIUM EXTENDED 100 MG PO CAPS: 100.0000 mg | ORAL_CAPSULE | Freq: Three times a day (TID) | ORAL | 0 refills | Status: DC

## 2017-07-30 MED ORDER — LORAZEPAM 2 MG/ML IJ SOLN
1.0000 mg | Freq: Once | INTRAMUSCULAR | Status: AC
  Administered 2017-07-30: 1 mg via INTRAVENOUS
  Filled 2017-07-30: qty 1

## 2017-07-30 MED ORDER — SODIUM CHLORIDE 0.9 % IV SOLN
15.0000 mg/kg | Freq: Once | INTRAVENOUS | Status: DC
  Filled 2017-07-30: qty 24.48

## 2017-07-30 MED ORDER — FOSPHENYTOIN SODIUM 100 MG PE/2ML IJ SOLN
15.0000 mg/kg | Freq: Once | INTRAMUSCULAR | Status: DC
  Filled 2017-07-30: qty 24.48

## 2017-07-30 MED ORDER — KETOROLAC TROMETHAMINE 30 MG/ML IJ SOLN
30.0000 mg | Freq: Once | INTRAMUSCULAR | Status: AC
  Administered 2017-07-30: 30 mg via INTRAVENOUS
  Filled 2017-07-30: qty 1

## 2017-07-30 MED ORDER — FOSPHENYTOIN SODIUM 500 MG PE/10ML IJ SOLN
INTRAMUSCULAR | Status: AC
  Filled 2017-07-30: qty 30

## 2017-07-30 NOTE — ED Notes (Signed)
Pt states she does not need to urinate at this time, aware of DO  

## 2017-07-30 NOTE — ED Notes (Signed)
Informed patient that we need urine sample patient could not use bathroom

## 2017-07-30 NOTE — Discharge Instructions (Signed)
Follow up with your neurologist as we discussed.  Consider taking the dilantin in the meantime as recommended by the neurologist.

## 2017-07-30 NOTE — ED Triage Notes (Signed)
Seizure call around 130pm today and pt refused to be transported. Pt mother called neuro doctor and doc doubled her seizure meds prior to having another seizure again this afternoon. Pt agreed to come to ED after second seizure. CBG 132.

## 2017-07-30 NOTE — ED Notes (Signed)
Pt ambulatory to waiting room. Pt verbalized understanding of discharge instructions.   

## 2017-07-30 NOTE — ED Provider Notes (Signed)
[facility] EMERGENCY DEPARTMENT Provider Note   CSN: [id number] Arrival date & time: 07/30/17  1742     History   Chief Complaint Chief Complaint  Patient presents with  . Seizures    HPI Whitney Scott is a 32 y.o. female.  HPI Pt presents to the ED for evaluation of seizures.  Pt has a history of seizures.  She is on topiramate.   She took her dose today and had a seizure later in the day.  She called her neurologist who recommended she take an extra dose.   Pt had an additional seizure today and came to the ED.  She is back at her baseline now.  No fevers.  No vomiting or diarrhea.  She does have a headache after the seizure. Past Medical History:  Diagnosis Date  . Anxiety   . Chronic headache   . Endometriosis   . Epilepsy (HCC)   . Nocturnal seizures (HCC) 06/23/2014  . Obesity   . Seizures [facility])     Patient Active Problem List   Diagnosis Date Noted  . Nocturnal seizures (HCC) 06/23/2014  . Head ache 06/23/2014    Past Surgical History:  Procedure Laterality Date  . CESAREAN SECTION    . COLON SURGERY    . TUBAL LIGATION Bilateral     OB History    Gravida Para Term Preterm AB Living   2 2   2   2    SAB TAB Ectopic Multiple Live Births                   Home Medications    Prior to Admission medications   Medication Sig Start Date End Date Taking? Authorizing Provider  Topiramate ER (TROKENDI XR) 50 MG CP24 Take 2 tablets by mouth daily. Patient taking differently: Take 2 tablets by mouth 2 (two) times daily.  07/02/17  Yes Long, Arlyss Repress, MD  Vitamin D, Ergocalciferol, (DRISDOL) 50000 units CAPS capsule Take 50,000 Units by mouth every 7 (seven) days.   Yes [provider]  metoCLOPramide (REGLAN) 10 MG tablet Take 1 tablet (10 mg total) by mouth every 6 (six) hours as needed for nausea (nausea/headache). Patient not taking: Reported on 07/02/2017 04/18/17   Doug Sou, MD    Family History Family History  Problem Relation Age  of Onset  . Seizures Mother   . Diabetes Mother   . Heart failure Mother   . Cancer Father   . Seizures Maternal Uncle     Social History Social History   Tobacco Use  . Smoking status: Current Some Day Smoker    Packs/day: 0.25    Years: 5.00    Pack years: 1.25    Types: Cigarettes  . Smokeless tobacco: Never Used  Substance Use Topics  . Alcohol use: Yes    Comment: occ  . Drug use: No     Allergies   Azithromycin; Bactrim; Cephalexin; Keppra [levetiracetam]; Penicillins; Ultram [tramadol]; and Vimpat [lacosamide]   Review of Systems Review of Systems  All other systems reviewed and are negative.    Physical Exam Updated Vital Signs BP 97/75   Pulse 87   Temp 98 F (36.7 C) (Oral)   Resp 20   Ht 1.6 m (5\' 3" )   Wt 81.6 kg (180 lb)   LMP 07/02/2017   SpO2 97%   BMI 31.89 kg/m   Physical Exam  Constitutional: She appears well-developed and well-nourished. No distress.  HENT:  Head: Normocephalic and  atraumatic.  Right Ear: External ear normal.  Left Ear: External ear normal.  Eyes: Conjunctivae are normal. Right eye exhibits no discharge. Left eye exhibits no discharge. No scleral icterus.  Neck: Neck supple. No tracheal deviation present.  Cardiovascular: Normal rate, regular rhythm and intact distal pulses.  Pulmonary/Chest: Effort normal and breath sounds normal. No stridor. No respiratory distress. She has no wheezes. She has no rales.  Abdominal: Soft. Bowel sounds are normal. She exhibits no distension. There is no tenderness. There is no rebound and no guarding.  Musculoskeletal: She exhibits no edema or tenderness.  Neurological: She is alert. She has normal strength. No cranial nerve deficit (no facial droop, extraocular movements intact, no slurred speech) or sensory deficit. She exhibits normal muscle tone. She displays no seizure activity. Coordination normal.  Skin: Skin is warm and dry. No rash noted.  Psychiatric: She has a normal mood  and affect.  Nursing note and vitals reviewed.    ED Treatments / Results  Labs (all labs ordered are listed, but only abnormal results are displayed) Labs Reviewed  BASIC METABOLIC PANEL - Abnormal; Notable for the following components:      Result Value   CO2 20 (*)    Glucose, Bld 100 (*)    All other components within normal limits  CBG MONITORING, ED - Abnormal; Notable for the following components:   Glucose-Capillary 110 (*)    All other components within normal limits  ETHANOL  CBC WITH DIFFERENTIAL/PLATELET  RAPID URINE DRUG SCREEN, HOSP PERFORMED  URINALYSIS, ROUTINE W REFLEX MICROSCOPIC  I-STAT BETA HCG BLOOD, ED ([facility], [facility], [facility] ONLY)     Procedures Procedures (including critical care time)  Medications Ordered in ED Medications  fosPHENYtoin (CEREBYX) injection 1,224 mg PE (not administered)  LORazepam (ATIVAN) injection 1 mg (1 mg Intravenous Given 07/30/17 1837)  ketorolac (TORADOL) 30 MG/ML injection 30 mg (30 mg Intravenous Given 07/30/17 2052)     Initial Impression / Assessment and Plan / ED Course  I have reviewed the triage vital signs and the nursing notes.  Pertinent labs & imaging results that were available during my care of the patient were reviewed by me and considered in my medical decision making (see chart for details).  Clinical Course as of Jul 31 2135  Mon Jul 30, 2017  2017 Pt complains of headache.  No recurrent seizure while in the ED.  Will consult with neurology regarding medication recommendations  [JK]  2134 Discussed with teleneurology.  Recommends fosphenytoin load then dilantin po.  Pt now tells me they tried that in the past and it didn't work. She is willing to try that dose tonight.  [JK]    Clinical Course User Index [JK] Linwood Dibbles, MD    Emergency room with history of recurrent seizures.  According to the patient and her mother the patient had been seizure-free for a couple years.  Only in the last few weeks that she had  recurrent seizures.  She has had difficulty with medications in the past.  She has been on several that were not effective.  The topiramate had been more effective recently.  I attempted to contact Dr. Gerilyn Pilgrim but he is not on call tonight.  I spoke with a tele-neurologist who made medication recommendations.  Patient was concerned that she been on that and had not helped in the past.  Patient remained stable.  She has not had any further seizures.  I do think she is safe to follow-up with her  neurologist tomorrow.   Final Clinical Impressions(s) / ED Diagnoses   Final diagnoses:  Seizure disorder [facility](HCC)    ED Discharge Orders    None       Linwood DibblesKnapp, Jacquelina Hewins, MD 07/30/17 2137

## 2017-08-02 ENCOUNTER — Ambulatory Visit (HOSPITAL_COMMUNITY)
Admission: RE | Admit: 2017-08-02 | Discharge: 2017-08-02 | Disposition: A | Discharge status: Home / Self Care | Payer: Medicaid Other | Source: Ambulatory Visit | Attending: Neurology | Admitting: Neurology

## 2017-08-02 ENCOUNTER — Other Ambulatory Visit: Payer: Self-pay | Admitting: Neurology

## 2017-08-02 DIAGNOSIS — W19XXXA Unspecified fall, initial encounter: Secondary | ICD-10-CM | POA: Insufficient documentation

## 2017-08-02 DIAGNOSIS — R569 Unspecified convulsions: Secondary | ICD-10-CM | POA: Diagnosis not present

## 2017-08-02 DIAGNOSIS — S0990XA Unspecified injury of head, initial encounter: Secondary | ICD-10-CM

## 2017-09-19 ENCOUNTER — Encounter (HOSPITAL_COMMUNITY): Payer: Self-pay | Admitting: Emergency Medicine

## 2017-09-19 ENCOUNTER — Other Ambulatory Visit: Payer: Self-pay

## 2017-09-19 ENCOUNTER — Emergency Department (HOSPITAL_COMMUNITY)
Admission: EM | Admit: 2017-09-19 | Discharge: 2017-09-19 | Discharge status: Against Medical Advice | Payer: Medicaid Other | Attending: Emergency Medicine | Admitting: Emergency Medicine

## 2017-09-19 DIAGNOSIS — Z5321 Procedure and treatment not carried out due to patient leaving prior to being seen by health care provider: Secondary | ICD-10-CM | POA: Insufficient documentation

## 2017-09-19 DIAGNOSIS — J101 Influenza due to other identified influenza virus with other respiratory manifestations: Secondary | ICD-10-CM | POA: Insufficient documentation

## 2017-09-19 NOTE — ED Notes (Signed)
Called for room with no response. Have not seen in waiting room for > 30 minutes.

## 2017-09-19 NOTE — ED Triage Notes (Signed)
Pt sent over by [facility] for dehydration and hypotension, and test positive for influenza A. Paperwork states she was 88/60 BP.

## 2017-11-04 ENCOUNTER — Encounter (HOSPITAL_COMMUNITY): Payer: Self-pay | Admitting: Emergency Medicine

## 2017-11-04 ENCOUNTER — Emergency Department (HOSPITAL_COMMUNITY)
Admission: EM | Admit: 2017-11-04 | Discharge: 2017-11-04 | Disposition: A | Discharge status: Home / Self Care | Payer: Medicaid Other | Attending: Emergency Medicine | Admitting: Emergency Medicine

## 2017-11-04 ENCOUNTER — Emergency Department (HOSPITAL_COMMUNITY): Payer: Medicaid Other

## 2017-11-04 DIAGNOSIS — Z79899 Other long term (current) drug therapy: Secondary | ICD-10-CM | POA: Diagnosis not present

## 2017-11-04 DIAGNOSIS — F17210 Nicotine dependence, cigarettes, uncomplicated: Secondary | ICD-10-CM | POA: Diagnosis not present

## 2017-11-04 DIAGNOSIS — R1032 Left lower quadrant pain: Secondary | ICD-10-CM | POA: Diagnosis not present

## 2017-11-04 DIAGNOSIS — R102 Pelvic and perineal pain: Secondary | ICD-10-CM | POA: Diagnosis present

## 2017-11-04 LAB — URINALYSIS, ROUTINE W REFLEX MICROSCOPIC
BILIRUBIN URINE: NEGATIVE
Glucose, UA: NEGATIVE mg/dL
Hgb urine dipstick: NEGATIVE
Ketones, ur: NEGATIVE mg/dL
LEUKOCYTES UA: NEGATIVE
NITRITE: NEGATIVE
PROTEIN: NEGATIVE mg/dL
Specific Gravity, Urine: 1.018 (ref 1.005–1.030)
pH: 5 (ref 5.0–8.0)

## 2017-11-04 LAB — CBC WITH DIFFERENTIAL/PLATELET
Basophils Absolute: 0 10*3/uL (ref 0.0–0.1)
Basophils Relative: 0 %
EOS ABS: 0.1 10*3/uL (ref 0.0–0.7)
EOS PCT: 1 %
HCT: 42.7 % (ref 36.0–46.0)
HEMOGLOBIN: 15 g/dL (ref 12.0–15.0)
LYMPHS ABS: 3.8 10*3/uL (ref 0.7–4.0)
LYMPHS PCT: 37 %
MCH: 31.8 pg (ref 26.0–34.0)
MCHC: 35.1 g/dL (ref 30.0–36.0)
MCV: 90.5 fL (ref 78.0–100.0)
MONOS PCT: 5 %
Monocytes Absolute: 0.5 10*3/uL (ref 0.1–1.0)
Neutro Abs: 5.9 10*3/uL (ref 1.7–7.7)
Neutrophils Relative %: 57 %
PLATELETS: 366 10*3/uL (ref 150–400)
RBC: 4.72 MIL/uL (ref 3.87–5.11)
RDW: 13 % (ref 11.5–15.5)
WBC: 10.3 10*3/uL (ref 4.0–10.5)

## 2017-11-04 LAB — COMPREHENSIVE METABOLIC PANEL
ALK PHOS: 66 U/L (ref 38–126)
ALT: 18 U/L (ref 14–54)
ANION GAP: 12 (ref 5–15)
AST: 22 U/L (ref 15–41)
Albumin: 4.2 g/dL (ref 3.5–5.0)
BUN: 7 mg/dL (ref 6–20)
CO2: 22 mmol/L (ref 22–32)
Calcium: 9.4 mg/dL (ref 8.9–10.3)
Chloride: 104 mmol/L (ref 101–111)
Creatinine, Ser: 0.66 mg/dL (ref 0.44–1.00)
Glucose, Bld: 96 mg/dL (ref 65–99)
Potassium: 3.6 mmol/L (ref 3.5–5.1)
SODIUM: 138 mmol/L (ref 135–145)
Total Bilirubin: 0.6 mg/dL (ref 0.3–1.2)
Total Protein: 7.4 g/dL (ref 6.5–8.1)

## 2017-11-04 LAB — PREGNANCY, URINE: PREG TEST UR: NEGATIVE

## 2017-11-04 MED ORDER — KETOROLAC TROMETHAMINE 60 MG/2ML IM SOLN
60.0000 mg | Freq: Once | INTRAMUSCULAR | Status: AC
  Administered 2017-11-04: 60 mg via INTRAMUSCULAR
  Filled 2017-11-04: qty 2

## 2017-11-04 MED ORDER — ONDANSETRON 4 MG PO TBDP
4.0000 mg | ORAL_TABLET | Freq: Once | ORAL | Status: AC
  Administered 2017-11-04: 4 mg via ORAL
  Filled 2017-11-04: qty 1

## 2017-11-04 MED ORDER — OXYCODONE-ACETAMINOPHEN 5-325 MG PO TABS
2.0000 | ORAL_TABLET | Freq: Once | ORAL | Status: AC
  Administered 2017-11-04: 2 via ORAL
  Filled 2017-11-04: qty 2

## 2017-11-04 MED ORDER — OXYCODONE-ACETAMINOPHEN 5-325 MG PO TABS
1.0000 | ORAL_TABLET | Freq: Once | ORAL | Status: DC
Start: 2017-11-04 — End: 2017-11-04
  Filled 2017-11-04: qty 1

## 2017-11-04 NOTE — ED Provider Notes (Signed)
[facility] EMERGENCY DEPARTMENT Provider Note   CSN: [id number] Arrival date & time: 11/04/17  1253     History   Chief Complaint Chief Complaint  Patient presents with  . Pelvic Pain    HPI Whitney Scott is a 32 y.o. female.   Pelvic Pain  This is a new problem. The current episode started 3 to 5 hours ago. The problem occurs constantly. The problem has not changed since onset.Associated symptoms include abdominal pain. Nothing aggravates the symptoms. Nothing relieves the symptoms. She has tried nothing for the symptoms.  Abdominal Pain      Past Medical History:  Diagnosis Date  . Anxiety   . Chronic headache   . Endometriosis   . Epilepsy (HCC)   . Nocturnal seizures (HCC) 06/23/2014  . Obesity   . Seizures [facility])     Patient Active Problem List   Diagnosis Date Noted  . Nocturnal seizures (HCC) 06/23/2014  . Head ache 06/23/2014    Past Surgical History:  Procedure Laterality Date  . CESAREAN SECTION    . COLON SURGERY    . TUBAL LIGATION Bilateral      OB History    Gravida  2   Para  2   Term      Preterm  2   AB      Living  2     SAB      TAB      Ectopic      Multiple      Live Births               Home Medications    Prior to Admission medications   Medication Sig Start Date End Date Taking? Authorizing Provider  Ascorbic Acid (VITAMIN C) 100 MG tablet Take 100 mg by mouth daily.   Yes [provider]  diazepam (VALIUM) 5 MG tablet Take 5 mg by mouth every 6 (six) hours as needed for anxiety.   Yes [provider]  ibuprofen (ADVIL,MOTRIN) 200 MG tablet Take 600 mg by mouth every 6 (six) hours as needed.   Yes [provider]  Topiramate ER (TROKENDI XR) 50 MG CP24 Take 2 tablets by mouth daily. Patient taking differently: Take 2 tablets by mouth 2 (two) times daily.  07/02/17  Yes Long, Arlyss Repress, MD  Vitamin D, Ergocalciferol, (DRISDOL) 50000 units CAPS capsule Take 50,000 Units by mouth  every 7 (seven) days.   Yes [provider]    Family History Family History  Problem Relation Age of Onset  . Seizures Mother   . Diabetes Mother   . Heart failure Mother   . Cancer Father   . Seizures Maternal Uncle     Social History Social History   Tobacco Use  . Smoking status: Current Some Day Smoker    Packs/day: 0.25    Years: 5.00    Pack years: 1.25    Types: Cigarettes, Cigars  . Smokeless tobacco: Never Used  Substance Use Topics  . Alcohol use: Yes    Comment: rarely  . Drug use: No     Allergies   Azithromycin; Bactrim; Cephalexin; Keppra [levetiracetam]; Penicillins; Ultram [tramadol]; and Vimpat [lacosamide]   Review of Systems Review of Systems  Gastrointestinal: Positive for abdominal pain.  Genitourinary: Positive for pelvic pain.  All other systems reviewed and are negative.    Physical Exam Updated Vital Signs BP 118/87 (BP Location: Right Arm)   Pulse 91   Temp 98.4 F (  36.9 C) (Oral)   Resp 16   Ht 5\' 3"  (1.6 m)   Wt 84.8 kg (187 lb)   LMP 10/29/2017 Comment: neg u preg  SpO2 100%   BMI 33.13 kg/m   Physical Exam  Constitutional: She is oriented to person, place, and time. She appears well-developed and well-nourished.  HENT:  Head: Normocephalic and atraumatic.  Eyes: Conjunctivae and EOM are normal.  Neck: Normal range of motion.  Cardiovascular: Normal rate and regular rhythm.  Pulmonary/Chest: No stridor. No respiratory distress.  Abdominal: Soft. She exhibits no distension.  Musculoskeletal: Normal range of motion. She exhibits no edema or deformity.  Neurological: She is alert and oriented to person, place, and time. No cranial nerve deficit. Coordination normal.  Skin: Skin is warm and dry.  Nursing note and vitals reviewed.    ED Treatments / Results  Labs (all labs ordered are listed, but only abnormal results are displayed) Labs Reviewed  URINALYSIS, ROUTINE W REFLEX MICROSCOPIC - Abnormal; Notable  for the following components:      Result Value   APPearance HAZY (*)    All other components within normal limits  URINE CULTURE  CBC WITH DIFFERENTIAL/PLATELET  COMPREHENSIVE METABOLIC PANEL  PREGNANCY, URINE    EKG None  Radiology Ct Renal Stone Study  Result Date: 11/04/2017 CLINICAL DATA:  Left low back pain radiating into the back today. EXAM: CT ABDOMEN AND PELVIS WITHOUT CONTRAST TECHNIQUE: Multidetector CT imaging of the abdomen and pelvis was performed following the standard protocol without IV contrast. COMPARISON:  CT abdomen and pelvis 05/07/2010. FINDINGS: Lower chest: Minimal dependent atelectasis. Otherwise clear. No pleural or pericardial effusion. Hepatobiliary: A few small stones are seen within the gallbladder. No evidence of cholecystitis. Liver and biliary tree appear normal. Pancreas: Unremarkable. No pancreatic ductal dilatation or surrounding inflammatory changes. Spleen: Normal in size without focal abnormality. Adrenals/Urinary Tract: Adrenal glands are unremarkable. Kidneys are normal, without renal calculi, focal lesion, or hydronephrosis. Bladder is unremarkable. Stomach/Bowel: Stomach is within normal limits. Appendix appears normal. No evidence of bowel wall thickening, distention, or inflammatory changes. Vascular/Lymphatic: No significant vascular findings are present. No enlarged abdominal or pelvic lymph nodes. Reproductive: Uterus and bilateral adnexa are unremarkable. Other: No ascites. Musculoskeletal: Negative. IMPRESSION: Negative CT abdomen and pelvis. Electronically Signed   By: Drusilla Kanner M.D.   On: 11/04/2017 15:04    Procedures Procedures (including critical care time)  Medications Ordered in ED Medications  oxyCODONE-acetaminophen (PERCOCET/ROXICET) 5-325 MG per tablet 1 tablet (has no administration in time range)  ketorolac (TORADOL) injection 60 mg (has no administration in time range)  oxyCODONE-acetaminophen (PERCOCET/ROXICET) 5-325  MG per tablet 2 tablet (2 tablets Oral Given 11/04/17 1317)  ondansetron (ZOFRAN-ODT) disintegrating tablet 4 mg (4 mg Oral Given 11/04/17 1317)     Initial Impression / Assessment and Plan / ED Course  I have reviewed the triage vital signs and the nursing notes.  Pertinent labs & imaging results that were available during my care of the patient were reviewed by me and considered in my medical decision making (see chart for details).     Flank radiating to Pelvic pain of uncertain etiology. Improved significantly in ED. No e/o diverticultiis, renal stone, UTI or other causes. No vaginal symptoms to suggest PID. Will fu w/ pcp if no timproving.   Final Clinical Impressions(s) / ED Diagnoses   Final diagnoses:  Left lower quadrant pain    ED Discharge Orders    None  Latrell Reitan, Barbara CowerJason, MD 11/04/17 [phone]

## 2017-11-04 NOTE — ED Notes (Signed)
Pain in left pelvic region around to her back

## 2017-11-04 NOTE — ED Notes (Signed)
Making pt wait 10 mins before she is able to leave

## 2017-11-04 NOTE — ED Triage Notes (Signed)
Pt reports lower left side pain going around into back.  States it feels like when her endometriosis hurts.  Denies vag bleeding or discharge.  Denies urinary s/s.

## 2017-11-06 LAB — URINE CULTURE

## 2017-12-13 ENCOUNTER — Emergency Department (HOSPITAL_COMMUNITY): Payer: Medicaid Other

## 2017-12-13 ENCOUNTER — Encounter (HOSPITAL_COMMUNITY): Payer: Self-pay | Admitting: *Deleted

## 2017-12-13 ENCOUNTER — Other Ambulatory Visit: Payer: Self-pay

## 2017-12-13 ENCOUNTER — Emergency Department (HOSPITAL_COMMUNITY)
Admission: EM | Admit: 2017-12-13 | Discharge: 2017-12-13 | Disposition: A | Discharge status: Home / Self Care | Payer: Medicaid Other | Attending: Emergency Medicine | Admitting: Emergency Medicine

## 2017-12-13 DIAGNOSIS — Z79899 Other long term (current) drug therapy: Secondary | ICD-10-CM | POA: Insufficient documentation

## 2017-12-13 DIAGNOSIS — Y929 Unspecified place or not applicable: Secondary | ICD-10-CM | POA: Diagnosis not present

## 2017-12-13 DIAGNOSIS — S61231A Puncture wound without foreign body of left index finger without damage to nail, initial encounter: Secondary | ICD-10-CM | POA: Diagnosis not present

## 2017-12-13 DIAGNOSIS — Y9389 Activity, other specified: Secondary | ICD-10-CM | POA: Insufficient documentation

## 2017-12-13 DIAGNOSIS — Z23 Encounter for immunization: Secondary | ICD-10-CM | POA: Diagnosis not present

## 2017-12-13 DIAGNOSIS — Y999 Unspecified external cause status: Secondary | ICD-10-CM | POA: Diagnosis not present

## 2017-12-13 DIAGNOSIS — S61238A Puncture wound without foreign body of other finger without damage to nail, initial encounter: Secondary | ICD-10-CM

## 2017-12-13 DIAGNOSIS — F17210 Nicotine dependence, cigarettes, uncomplicated: Secondary | ICD-10-CM | POA: Diagnosis not present

## 2017-12-13 DIAGNOSIS — W268XXA Contact with other sharp object(s), not elsewhere classified, initial encounter: Secondary | ICD-10-CM | POA: Diagnosis not present

## 2017-12-13 DIAGNOSIS — L03012 Cellulitis of left finger: Secondary | ICD-10-CM

## 2017-12-13 MED ORDER — ONDANSETRON HCL 4 MG PO TABS
4.0000 mg | ORAL_TABLET | Freq: Once | ORAL | Status: AC
  Administered 2017-12-13: 4 mg via ORAL
  Filled 2017-12-13: qty 1

## 2017-12-13 MED ORDER — TETANUS-DIPHTH-ACELL PERTUSSIS 5-2.5-18.5 LF-MCG/0.5 IM SUSP
0.5000 mL | Freq: Once | INTRAMUSCULAR | Status: AC
  Administered 2017-12-13: 0.5 mL via INTRAMUSCULAR
  Filled 2017-12-13: qty 0.5

## 2017-12-13 MED ORDER — CLINDAMYCIN HCL 150 MG PO CAPS: 150.0000 mg | ORAL_CAPSULE | Freq: Four times a day (QID) | ORAL | 0 refills | Status: AC

## 2017-12-13 MED ORDER — LIDOCAINE HCL (PF) 1 % IJ SOLN
5.0000 mL | Freq: Once | INTRAMUSCULAR | Status: AC
  Administered 2017-12-13: 2.1 mL
  Filled 2017-12-13: qty 6

## 2017-12-13 MED ORDER — IBUPROFEN 600 MG PO TABS: 600.0000 mg | ORAL_TABLET | Freq: Four times a day (QID) | ORAL | 0 refills | Status: AC

## 2017-12-13 MED ORDER — CEFTRIAXONE SODIUM 1 G IJ SOLR
1.0000 g | Freq: Once | INTRAMUSCULAR | Status: AC
  Administered 2017-12-13: 1 g via INTRAMUSCULAR
  Filled 2017-12-13: qty 10

## 2017-12-13 MED ORDER — HYDROCODONE-ACETAMINOPHEN 5-325 MG PO TABS: 1.0000 | ORAL_TABLET | ORAL | 0 refills | Status: AC | PRN

## 2017-12-13 NOTE — ED Provider Notes (Signed)
[facility] EMERGENCY DEPARTMENT Provider Note   CSN: [id number] Arrival date & time: 12/13/17  1350     History   Chief Complaint Chief Complaint  Patient presents with  . Finger Injury    HPI Whitney Scott is a 32 y.o. female.  Patient is a 32 year old female who presents to the emergency department with a complaint of injury to the left index finger.  Patient states that on yesterday she was helping a friend carry some sheet metal.  She stumbled, fell, and sustained a injury puncture to the index finger of the left hand.  The patient states that she washed the area with saline solution.  This morning however she noted swelling extending from the puncture wound back to her knuckle area.  She also noticed pain with movement of the finger.  She was seen by a physician at urgent care.  They felt that she should come to the emergency department because of her multiple allergies to antibiotics and possible need for more advanced treatment.  Patient presents now for assistance with this issue.  Patient reports that her last tetanus shot was probably 8 years ago or more.     Past Medical History:  Diagnosis Date  . Anxiety   . Chronic headache   . Endometriosis   . Epilepsy (HCC)   . Nocturnal seizures (HCC) 06/23/2014  . Obesity   . Seizures [facility])     Patient Active Problem List   Diagnosis Date Noted  . Nocturnal seizures (HCC) 06/23/2014  . Head ache 06/23/2014    Past Surgical History:  Procedure Laterality Date  . CESAREAN SECTION    . COLON SURGERY    . TUBAL LIGATION Bilateral      OB History    Gravida  2   Para  2   Term      Preterm  2   AB      Living  2     SAB      TAB      Ectopic      Multiple      Live Births               Home Medications    Prior to Admission medications   Medication Sig Start Date End Date Taking? Authorizing Provider  Ascorbic Acid (VITAMIN C) 100 MG tablet Take 100 mg by mouth daily.    [provider]  diazepam (VALIUM) 5 MG tablet Take 5 mg by mouth every 6 (six) hours as needed for anxiety.    [provider]  ibuprofen (ADVIL,MOTRIN) 200 MG tablet Take 600 mg by mouth every 6 (six) hours as needed.    [provider]  Topiramate ER (TROKENDI XR) 50 MG CP24 Take 2 tablets by mouth daily. Patient taking differently: Take 2 tablets by mouth 2 (two) times daily.  07/02/17   Long, Arlyss Repress, MD  Vitamin D, Ergocalciferol, (DRISDOL) 50000 units CAPS capsule Take 50,000 Units by mouth every 7 (seven) days.    [provider]    Family History Family History  Problem Relation Age of Onset  . Seizures Mother   . Diabetes Mother   . Heart failure Mother   . Cancer Father   . Seizures Maternal Uncle     Social History Social History   Tobacco Use  . Smoking status: Current Some Day Smoker    Packs/day: 0.25    Years: 5.00    Pack years: 1.25  Types: Cigarettes, Cigars  . Smokeless tobacco: Never Used  Substance Use Topics  . Alcohol use: Yes    Comment: rarely  . Drug use: No     Allergies   Azithromycin; Bactrim; Cephalexin; Keppra [levetiracetam]; Penicillins; Ultram [tramadol]; and Vimpat [lacosamide]   Review of Systems Review of Systems  Constitutional: Negative for activity change.       All ROS Neg except as noted in HPI  HENT: Negative for nosebleeds.   Eyes: Negative for photophobia and discharge.  Respiratory: Negative for cough, shortness of breath and wheezing.   Cardiovascular: Negative for chest pain and palpitations.  Gastrointestinal: Negative for abdominal pain and blood in stool.  Genitourinary: Negative for dysuria, frequency and hematuria.  Musculoskeletal: Negative for arthralgias, back pain and neck pain.  Skin: Positive for wound.       Puncture wound to the finger  Neurological: Negative for dizziness, seizures and speech difficulty.  Psychiatric/Behavioral: Negative for confusion and hallucinations.      Physical Exam Updated Vital Signs BP (!) 117/95 (BP Location: Right Arm)   Pulse 70   Temp 98.7 F (37.1 C) (Oral)   Resp 16   Ht  (1.6 m)   Wt 83.5 kg (184 lb)   LMP 11/20/2017   SpO2 94%   BMI 32.59 kg/m   Physical Exam  Constitutional: She is oriented to person, place, and time. She appears well-developed and well-nourished.  Non-toxic appearance.  HENT:  Head: Normocephalic.  Right Ear: Tympanic membrane and external ear normal.  Left Ear: Tympanic membrane and external ear normal.  Eyes: Pupils are equal, round, and reactive to light. EOM and lids are normal.  Neck: Normal range of motion. Neck supple. Carotid bruit is not present.  Cardiovascular: Normal rate, regular rhythm, normal heart sounds, intact distal pulses and normal pulses.  Pulmonary/Chest: Breath sounds normal. No respiratory distress.  Abdominal: Soft. Bowel sounds are normal. There is no tenderness. There is no guarding.  Musculoskeletal: Normal range of motion.       Left hand: She exhibits laceration and swelling. Normal sensation noted.       Hands: Lymphadenopathy:       Head (right side): No submandibular adenopathy present.       Head (left side): No submandibular adenopathy present.    She has no cervical adenopathy.  Neurological: She is alert and oriented to person, place, and time. She has normal strength. No cranial nerve deficit or sensory deficit.  Skin: Skin is warm and dry.  Psychiatric: She has a normal mood and affect. Her speech is normal.  Nursing note and vitals reviewed.    ED Treatments / Results  Labs (all labs ordered are listed, but only abnormal results are displayed) Labs Reviewed - No data to display  EKG None  Radiology Dg Hand Complete Left  Result Date: 12/13/2017 CLINICAL DATA:  Piece of metal dropped on hand EXAM: LEFT HAND - COMPLETE 3+ VIEW COMPARISON:  None. FINDINGS: Frontal, oblique, and lateral views obtained. There is soft tissue swelling of  the second digit. There is no radiopaque foreign body or soft tissue air. No fracture or dislocation. Joint spaces appear normal. No erosive change. IMPRESSION: No fracture or dislocation. No evident arthropathy. No soft tissue air or radiopaque foreign body. There is soft tissue swelling of the second digit. Electronically Signed   By: Bretta Bang III M.D.   On: 12/13/2017 14:40    Procedures Procedures (including critical care time)  Medications Ordered in  ED Medications - No data to display   Initial Impression / Assessment and Plan / ED Course  I have reviewed the triage vital signs and the nursing notes.  Pertinent labs & imaging results that were available during my care of the patient were reviewed by me and considered in my medical decision making (see chart for details).       Final Clinical Impressions(s) / ED Diagnoses MDM  Vital signs reviewed.  Pulse oximetry is 94% on room air.  Within normal limits by my interpretation.  Patient sustained a puncture wound/laceration on yesterday from sheet-metal.  X-ray of the finger shows that it is negative for fracture or dislocation.  No gas appreciated.  No evidence of injury to the capsule.  Patient has some swelling from the area of the puncture wound extending to the to the area just below the MP joint.  This  Patient treated in the emergency department with intramuscular Rocephin.  Patient has an an intolerance to cephalosporins with complaint of nausea/vomiting.  Patient given Zofran prophylactically.  Patient's tetanus was updated.  Patient was put in a finger splint for the next 4 or 5 days.  The patient has been advised to soak the hand in warm Epson salt water for about 10 to 15 minutes daily, and she will use clindamycin daily.  The patient will use ibuprofen if she is able to tolerate it, and will give her medication for pain as well.  I have asked the patient to be rechecked in about 3 days to ensure that this  infection is improving.  Patient is in agreement with this plan.   Final diagnoses:  Puncture wound of index finger, initial encounter  Cellulitis of finger of left hand    ED Discharge Orders        Ordered    clindamycin (CLEOCIN) 150 MG capsule  Every 6 hours     12/13/17 1657    ibuprofen (ADVIL,MOTRIN) 600 MG tablet  4 times daily     12/13/17 1657    HYDROcodone-acetaminophen (NORCO/VICODIN) 5-325 MG tablet  Every 4 hours PRN     12/13/17 1657       Ivery Quale, PA-C 12/13/17 1658    Dione Booze, MD 12/13/17 [phone]

## 2017-12-13 NOTE — Discharge Instructions (Signed)
The x-ray of your finger is negative for evidence of fracture or dislocation.  Your examination shows a laceration/puncture wound with signs of infection and/or cellulitis.  You were treated in the emergency department with intramuscular Rocephin.  Your tetanus status was updated.  Please put this in your medical records on the date that your tetanus was updated.  Please use 600 mg of ibuprofen and 150 mg of clindamycin with breakfast, lunch, dinner, and at bedtime.  May use Norco for more severe pain.  This medication may cause drowsiness.  Please do not drive a vehicle, operate machinery, and illegal documents, or participate in activities requiring concentration when taking this medication.  Please see Dr. Margo Aye, or return to the emergency department in 3 days for recheck of your finger.

## 2017-12-13 NOTE — ED Triage Notes (Signed)
Pt states she was helping move sheet metal when a piece dropped on her left hand; pt has cut to left forefinger with swelling and redness; pt went to urgent care this am and was told to come to ED because she may need IV antibiotics

## 2018-06-08 IMAGING — CR DG HAND COMPLETE 3+V*L*
3 series · 3 of 3 positions shown · non-contrast
Comparison: None.

CLINICAL DATA: Piece of metal dropped on hand

EXAM:
LEFT HAND - COMPLETE 3+ VIEW

[x hand pa left]
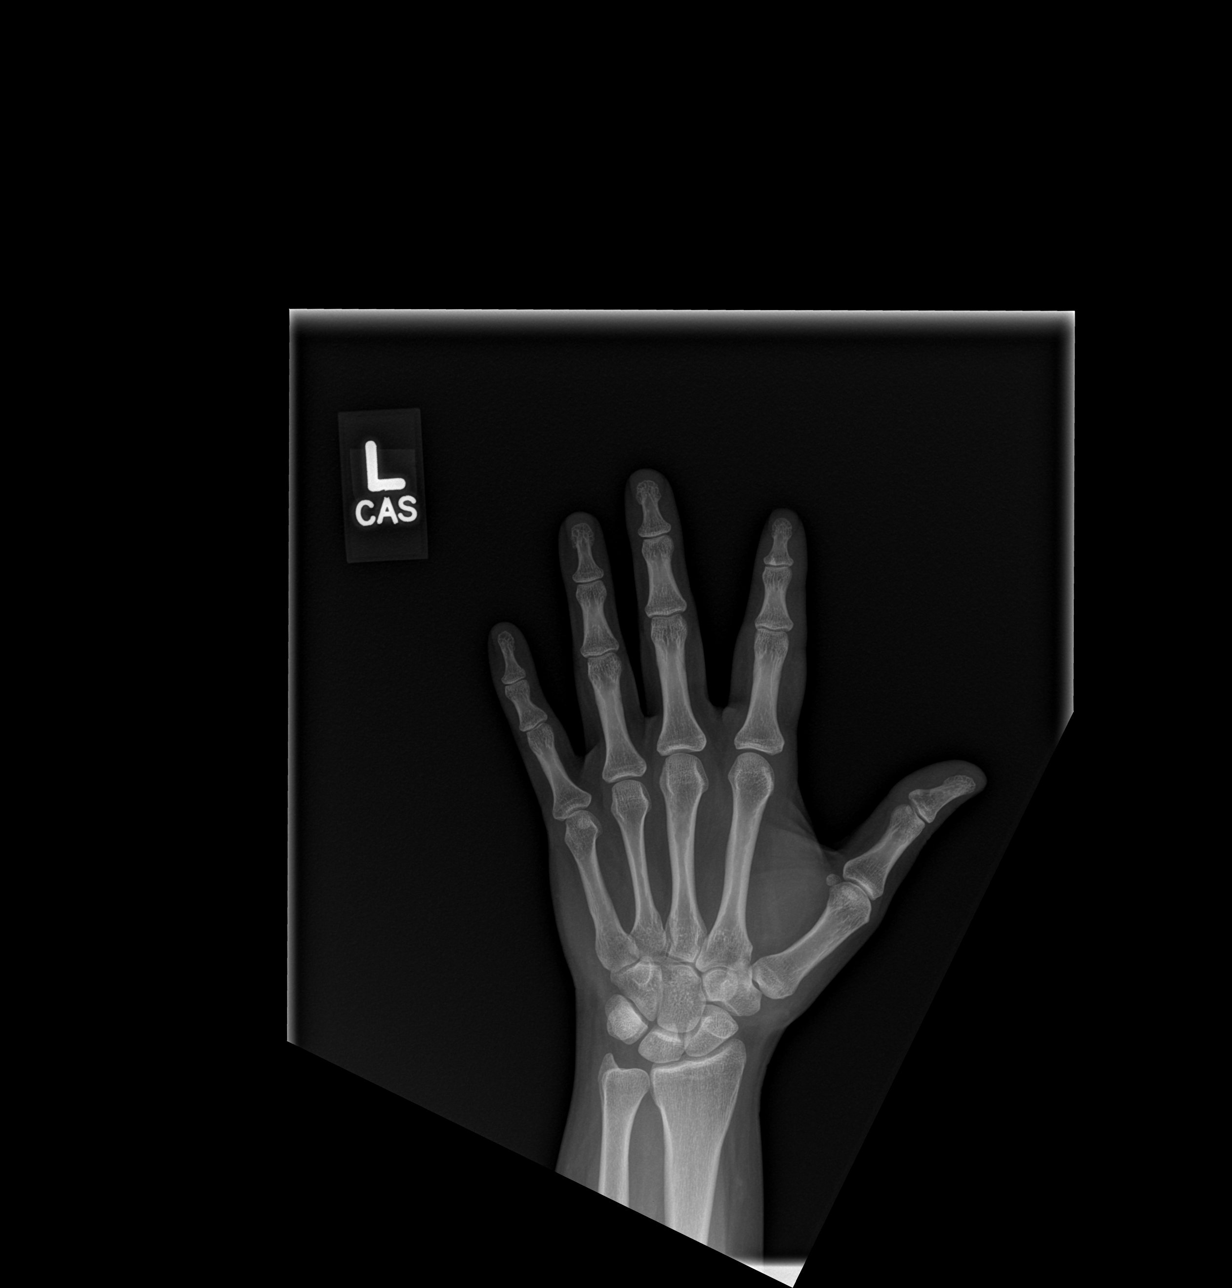

[x hand obl left]
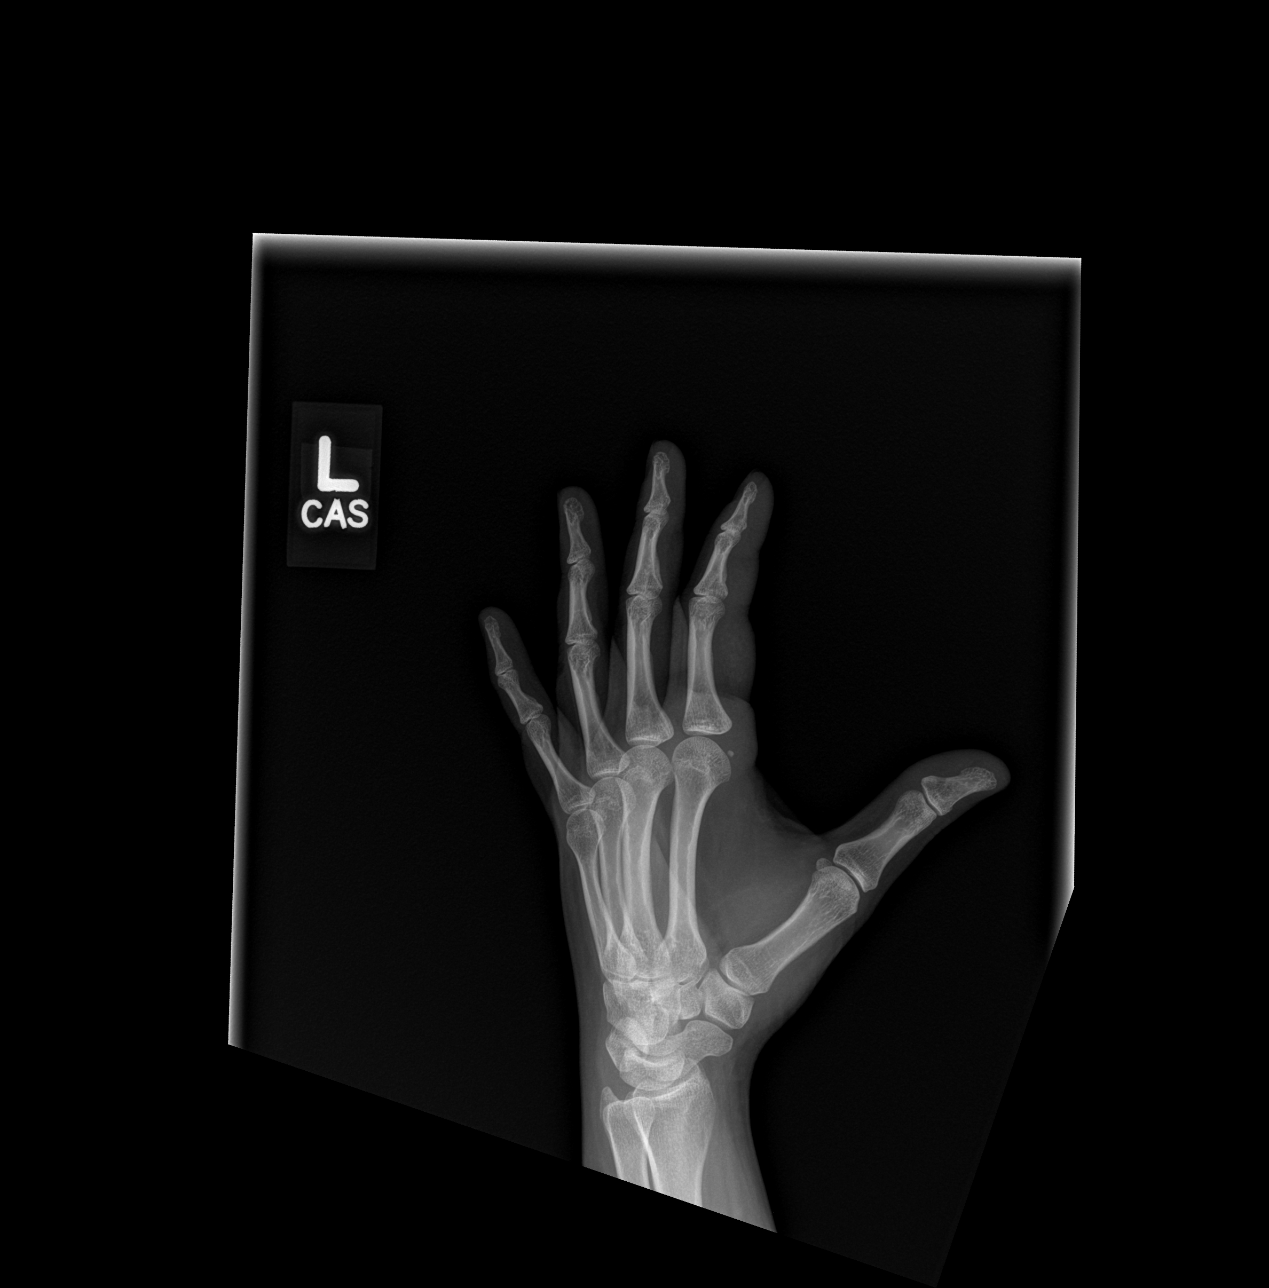

[x hand lat left]
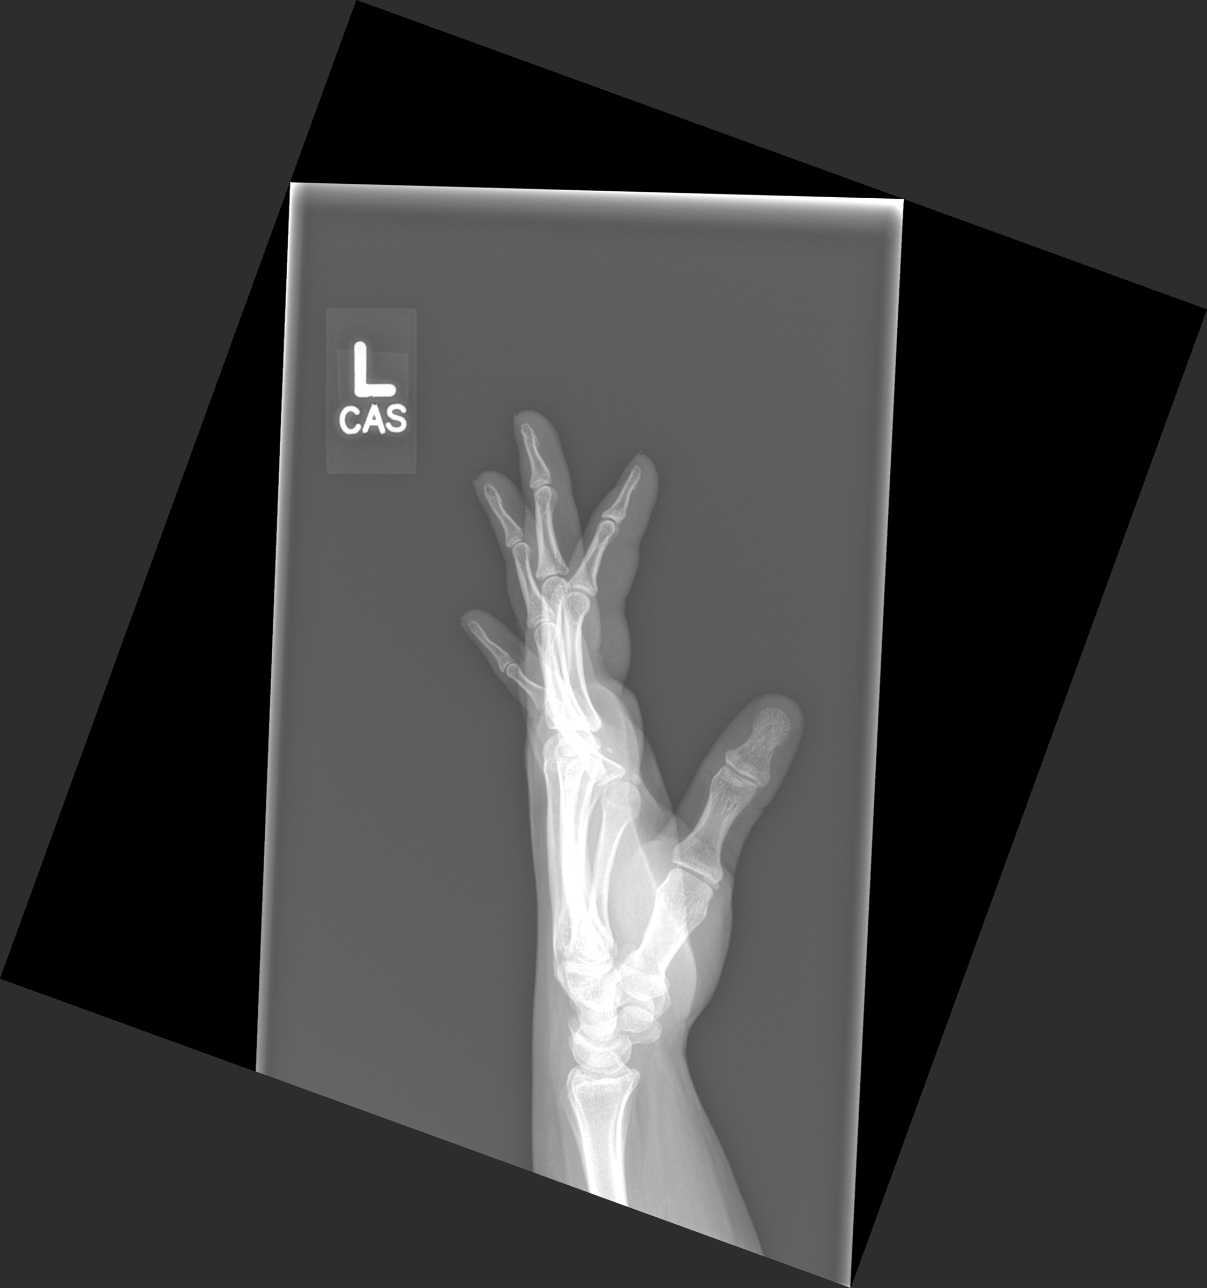

[3 of 3 positions shown; findings below may reference images not displayed]

FINDINGS: Frontal, oblique, and lateral views obtained. There is soft tissue
swelling of the second digit. There is no radiopaque foreign body or
soft tissue air. No fracture or dislocation. Joint spaces appear
normal. No erosive change.
IMPRESSION: No fracture or dislocation. No evident arthropathy. No soft tissue
air or radiopaque foreign body. There is soft tissue swelling of the
second digit.

## 2018-12-23 ENCOUNTER — Encounter (HOSPITAL_COMMUNITY): Payer: Self-pay | Admitting: Emergency Medicine

## 2018-12-23 ENCOUNTER — Other Ambulatory Visit: Payer: Self-pay

## 2018-12-23 ENCOUNTER — Emergency Department (HOSPITAL_COMMUNITY)
Admission: EM | Admit: 2018-12-23 | Discharge: 2018-12-23 | Disposition: A | Discharge status: Home / Self Care | Payer: Medicaid Other | Attending: Emergency Medicine | Admitting: Emergency Medicine

## 2018-12-23 DIAGNOSIS — Z5321 Procedure and treatment not carried out due to patient leaving prior to being seen by health care provider: Secondary | ICD-10-CM | POA: Diagnosis not present

## 2018-12-23 DIAGNOSIS — R569 Unspecified convulsions: Secondary | ICD-10-CM | POA: Diagnosis present

## 2018-12-23 NOTE — ED Triage Notes (Addendum)
Pt had a seizure this am   Called Dr Nobie Putnam who suggested she come in for eval   Reports has taken seizure meds and not missed a dose  She reports last seizure several years ago

## 2020-06-01 ENCOUNTER — Ambulatory Visit: Payer: Self-pay

## 2020-08-24 ENCOUNTER — Other Ambulatory Visit: Payer: Self-pay

## 2020-08-24 ENCOUNTER — Encounter (HOSPITAL_COMMUNITY): Payer: Self-pay | Admitting: *Deleted

## 2020-08-24 ENCOUNTER — Emergency Department (HOSPITAL_COMMUNITY)
Admission: EM | Admit: 2020-08-24 | Discharge: 2020-08-24 | Disposition: A | Discharge status: Home / Self Care | Payer: Medicaid Other | Attending: Emergency Medicine | Admitting: Emergency Medicine

## 2020-08-24 DIAGNOSIS — Z79899 Other long term (current) drug therapy: Secondary | ICD-10-CM | POA: Diagnosis not present

## 2020-08-24 DIAGNOSIS — Z87891 Personal history of nicotine dependence: Secondary | ICD-10-CM | POA: Insufficient documentation

## 2020-08-24 DIAGNOSIS — R569 Unspecified convulsions: Secondary | ICD-10-CM | POA: Insufficient documentation

## 2020-08-24 LAB — BASIC METABOLIC PANEL
Anion gap: 7 (ref 5–15)
BUN: 13 mg/dL (ref 6–20)
CO2: 22 mmol/L (ref 22–32)
Calcium: 9 mg/dL (ref 8.9–10.3)
Chloride: 104 mmol/L (ref 98–111)
Creatinine, Ser: 0.69 mg/dL (ref 0.44–1.00)
GFR, Estimated: >60 mL/min (ref >60)
Glucose, Bld: 99 mg/dL (ref 70–99)
Potassium: 3.7 mmol/L (ref 3.5–5.1)
Sodium: 133 mmol/L — ABNORMAL LOW (ref 135–145)

## 2020-08-24 LAB — CBC
HCT: 40.6 % (ref 36.0–46.0)
Hemoglobin: 13.9 g/dL (ref 12.0–15.0)
MCH: 31.2 pg (ref 26.0–34.0)
MCHC: 34.2 g/dL (ref 30.0–36.0)
MCV: 91.2 fL (ref 80.0–100.0)
Platelets: 360 10*3/uL (ref 150–400)
RBC: 4.45 MIL/uL (ref 3.87–5.11)
RDW: 12.6 % (ref 11.5–15.5)
WBC: 11 10*3/uL — ABNORMAL HIGH (ref 4.0–10.5)
nRBC: 0 % (ref 0.0–0.2)

## 2020-08-24 LAB — CBG MONITORING, ED: Glucose-Capillary: 88 mg/dL (ref 70–99)

## 2020-08-24 MED ORDER — ACETAMINOPHEN 325 MG PO TABS
650.0000 mg | ORAL_TABLET | Freq: Once | ORAL | Status: AC
  Administered 2020-08-24: 650 mg via ORAL
  Filled 2020-08-24: qty 2

## 2020-08-24 MED ORDER — TROKENDI XR 25 MG PO CP24: ORAL_CAPSULE | ORAL | 0 refills | Status: AC

## 2020-08-24 MED ORDER — KETOROLAC TROMETHAMINE 30 MG/ML IJ SOLN
30.0000 mg | Freq: Once | INTRAMUSCULAR | Status: AC
  Administered 2020-08-24: 30 mg via INTRAMUSCULAR
  Filled 2020-08-24: qty 1

## 2020-08-24 NOTE — ED Triage Notes (Signed)
States she had a seizure today, states her seizure medication was discontinued about 18 months ago

## 2020-08-24 NOTE — ED Provider Notes (Signed)
[facility] EMERGENCY DEPARTMENT Provider Note   CSN: [id number] Arrival date & time: 08/24/20  1612     History Chief Complaint  Patient presents with  . Seizures    Whitney Scott is a 35 y.o. female with a past medical history significant for anxiety, endometriosis, nocturnal seizures not currently on any medication who presents to the ED due to seizure-like activity.  Spoke to patient's mother who said that patient had full body convulsions for roughly 5 to 7 minutes.  During the episode, mother noted patient's lips to turn blue.  Patient admits to biting her tongue and urinary incontinence.  Patient was taken off topiramate roughly 18 months ago due to no seizure activity for the past 2 to 3 years.  Patient is followed by Dr. Ovidio Kin with neurology. No head injury. Mother states that patient was post-ictal for roughly 20 minutes. No other injuries as patient was lying in bed during seizure-like activity. Patient states she was at her normal state of health prior to today.  Denies fever, chills, chest pain, shortness of breath.  She admits to being "sore all over". No treatment prior to arrival. No aggravating or alleviating factors.   History obtained from patient and past medical records. No interpreter used during encounter.      Past Medical History:  Diagnosis Date  . Anxiety   . Chronic headache   . Endometriosis   . Epilepsy (HCC)   . Nocturnal seizures (HCC) 06/23/2014  . Obesity   . Seizures [facility]     Patient Active Problem List   Diagnosis Date Noted  . Nocturnal seizures (HCC) 06/23/2014  . Head ache 06/23/2014    Past Surgical History:  Procedure Laterality Date  . CESAREAN SECTION    . COLON SURGERY    . TUBAL LIGATION Bilateral      OB History    Gravida  2   Para  2   Term      Preterm  2   AB      Living  2     SAB      IAB      Ectopic      Multiple      Live Births              Family History  Problem Relation Age of  Onset  . Seizures Mother   . Diabetes Mother   . Heart failure Mother   . Cancer Father   . Seizures Maternal Uncle     Social History   Tobacco Use  . Smoking status: Former Smoker    Packs/day: 0.25    Years: 5.00    Pack years: 1.25    Types: Cigarettes, Cigars  . Smokeless tobacco: Never Used  Vaping Use  . Vaping Use: Never used  Substance Use Topics  . Alcohol use: Yes    Comment: rarely  . Drug use: No    Home Medications Prior to Admission medications   Medication Sig Start Date End Date Taking? Authorizing Provider  Topiramate ER (TROKENDI XR) 25 MG CP24 Once daily dosing. Start at 25mg  nightly and go up by 25 mg every 3 days until at 200 mg nightly 08/24/20  Yes Fathima Bartl, 10/22/20, PA-C  Ascorbic Acid (VITAMIN C) 100 MG tablet Take 100 mg by mouth daily.    [provider]  clindamycin (CLEOCIN) 150 MG capsule Take 1 capsule (150 mg total) by mouth every 6 (six) hours. Take this medication with  food. 12/13/17   Ivery Quale, PA-C  diazepam (VALIUM) 5 MG tablet Take 5 mg by mouth every 6 (six) hours as needed for anxiety.    [provider]  HYDROcodone-acetaminophen (NORCO/VICODIN) 5-325 MG tablet Take 1 tablet by mouth every 4 (four) hours as needed. Take this medication with food. 12/13/17   Ivery Quale, PA-C  ibuprofen (ADVIL,MOTRIN) 600 MG tablet Take 1 tablet (600 mg total) by mouth 4 (four) times daily. 12/13/17   Ivery Quale, PA-C  Topiramate ER (TROKENDI XR) 50 MG CP24 Take 2 tablets by mouth daily. Patient taking differently: Take 2 tablets by mouth 2 (two) times daily.  07/02/17   Long, Arlyss Repress, MD  Vitamin D, Ergocalciferol, (DRISDOL) 50000 units CAPS capsule Take 50,000 Units by mouth every 7 (seven) days.    [provider]    Allergies    Azithromycin, Bactrim, Cephalexin, Keppra [levetiracetam], Penicillins, Ultram [tramadol], and Vimpat [lacosamide]  Review of Systems   Review of Systems  Constitutional: Negative  for chills and fever.  Respiratory: Negative for shortness of breath.   Cardiovascular: Negative for chest pain.  Gastrointestinal: Negative for abdominal pain.  Neurological: Positive for seizures. Negative for dizziness.  All other systems reviewed and are negative.   Physical Exam Updated Vital Signs BP 106/73   Pulse 72   Temp 98.2 F (36.8 C) (Oral)   Resp 19   Ht 5\' 3"  (1.6 m)   Wt 83 kg   SpO2 99%   BMI 32.42 kg/m   Physical Exam Vitals and nursing note reviewed.  Constitutional:      General: She is not in acute distress.    Appearance: She is not ill-appearing.  HENT:     Head: Normocephalic.     Mouth/Throat:     Comments: Tongue trauma noted with dried blood in mouth. Eyes:     Pupils: Pupils are equal, round, and reactive to light.  Cardiovascular:     Rate and Rhythm: Normal rate and regular rhythm.     Pulses: Normal pulses.     Heart sounds: Normal heart sounds. No murmur heard. No friction rub. No gallop.   Pulmonary:     Effort: Pulmonary effort is normal.     Breath sounds: Normal breath sounds.  Abdominal:     General: Abdomen is flat. Bowel sounds are normal. There is no distension.     Palpations: Abdomen is soft.     Tenderness: There is no abdominal tenderness. There is no guarding or rebound.  Musculoskeletal:     Cervical back: Neck supple.     Comments: Able to move all 4 extremities without difficulty.  Skin:    General: Skin is warm and dry.  Neurological:     General: No focal deficit present.     Mental Status: She is alert.     Comments: Speech is clear, able to follow commands CN III-XII intact Normal strength in upper and lower extremities bilaterally including dorsiflexion and plantar flexion, strong and equal grip strength Sensation grossly intact throughout Moves extremities without ataxia, coordination intact No pronator drift Ambulates without difficulty  Psychiatric:        Mood and Affect: Mood normal.         Behavior: Behavior normal.     ED Results / Procedures / Treatments   Labs (all labs ordered are listed, but only abnormal results are displayed) Labs Reviewed  BASIC METABOLIC PANEL - Abnormal; Notable for the following components:  Result Value   Sodium 133 (*)    All other components within normal limits  CBC - Abnormal; Notable for the following components:   WBC 11.0 (*)    All other components within normal limits  RAPID URINE DRUG SCREEN, HOSP PERFORMED  CBG MONITORING, ED  POC URINE PREG, ED    EKG None  Radiology No results found.  Procedures Procedures   Medications Ordered in ED Medications  ketorolac (TORADOL) 30 MG/ML injection 30 mg (has no administration in time range)  acetaminophen (TYLENOL) tablet 650 mg (650 mg Oral Given 08/24/20 1802)    ED Course  I have reviewed the triage vital signs and the nursing notes.  Pertinent labs & imaging results that were available during my care of the patient were reviewed by me and considered in my medical decision making (see chart for details).  Clinical Course as of 08/24/20 1859  Tue Aug 24, 2020  1845 Recommendations from neurology for dosing of Trokendi: Once daily dosing. Start at 25 nightly and go up by 25 mg every 3 days until she's at 200 mg nightly   [CA]    Clinical Course User Index [CA] Mannie Stabile, PA-C   MDM Rules/Calculators/A&P                         35 year old female presents to the ED due to seizure-like activity that lasted roughly 5 to 7 minutes witnessed by patient's mother.  Patient has a history of nocturnal seizures and was recently taken off topiramate roughly 18 months ago.  Last seizure was 2 to 3 years ago.  Patient was previously followed by Dr. Gerilyn Pilgrim on with neurology.  Upon arrival, vitals all within normal limits.  Patient in no acute distress and non-ill-appearing.  Physical exam reassuring.  Normal neurological exam.  Minor tongue trauma with dried blood.   Patient no longer postictal during my initial evaluation.  Routine labs ordered at triage.  UDS added.  CBC reassuring with mild leukocytosis at 11.  Normal hemoglobin.  BMP significant for hyponatremia at 133, but otherwise unremarkable.  Normal renal function.  No further electrolyte derangements.  Discussed case with Dr. Iver Nestle with neurology who recommends restarting patient on Trokendi XR. See note above. Dr. Iver Nestle also recommended MRI since her last MRI was in 2012; however, patient deferred imaging because she states she needs to get home to watch her children.  6:56 PM informed by RN that patient would like to leave.  Patient unable to give urine sample.  Patient states she had a tubal ligation and has no concerns about pregnancy.  Toradol given for continued headache.  Patient discharged with prescription for topiramate XR per dosing above.  Advised patient to call neurologist tomorrow to schedule appointment for further evaluation within the next few days. Strict ED precautions discussed with patient. Patient states understanding and agrees to plan. Patient discharged home in no acute distress and stable vitals. Final Clinical Impression(s) / ED Diagnoses Final diagnoses:  Seizure-like activity [facility])    Rx / DC Orders ED Discharge Orders         Ordered    Topiramate ER (TROKENDI XR) 25 MG CP24        08/24/20 1849           Jesusita Oka 08/24/20 1859    Mancel Bale, MD 08/25/20 1350

## 2020-08-24 NOTE — ED Notes (Signed)
Pt informed of need for urine sample, but pt reports she can't give one at this time.

## 2020-08-24 NOTE — Discharge Instructions (Addendum)
As discussed, all of your labs are reassuring today.  I am restarting you on Trokendi XR.  Take 25 mg nightly and increase by 25 mg every 3 days until reaching 200 mg nightly.  Please call Dr. Gerilyn Pilgrim tomorrow to schedule an appointment for further evaluation.  Return to the ER for new or worsening symptoms.

## 2020-08-24 NOTE — ED Notes (Signed)
CBG 88 

## 2020-08-24 NOTE — ED Notes (Signed)
Asked pt again for urine sample. Pt still unable to give sample at this time. Pt's mother reports that usually when pt has a seizure and urinates on herself it takes awhile before she goes again. PA notified.
